# Patient Record
Sex: Male | Born: 1955 | Race: White | Hispanic: Yes | Marital: Married | Smoking: Former smoker
Health system: Southern US, Community
[De-identification: ages and names within clinical notes are randomized; demographics above are authoritative.]

## PROBLEM LIST (undated history)

## (undated) DIAGNOSIS — M4312 Spondylolisthesis, cervical region: Secondary | ICD-10-CM

## (undated) DIAGNOSIS — M1990 Unspecified osteoarthritis, unspecified site: Secondary | ICD-10-CM

## (undated) DIAGNOSIS — D492 Neoplasm of unspecified behavior of bone, soft tissue, and skin: Secondary | ICD-10-CM

## (undated) DIAGNOSIS — N521 Erectile dysfunction due to diseases classified elsewhere: Secondary | ICD-10-CM

## (undated) DIAGNOSIS — N400 Enlarged prostate without lower urinary tract symptoms: Secondary | ICD-10-CM

## (undated) DIAGNOSIS — I10 Essential (primary) hypertension: Secondary | ICD-10-CM

## (undated) DIAGNOSIS — K859 Acute pancreatitis without necrosis or infection, unspecified: Secondary | ICD-10-CM

## (undated) DIAGNOSIS — M47812 Spondylosis without myelopathy or radiculopathy, cervical region: Secondary | ICD-10-CM

## (undated) DIAGNOSIS — K219 Gastro-esophageal reflux disease without esophagitis: Secondary | ICD-10-CM

## (undated) DIAGNOSIS — Z1509 Genetic susceptibility to other malignant neoplasm: Secondary | ICD-10-CM

## (undated) DIAGNOSIS — E785 Hyperlipidemia, unspecified: Secondary | ICD-10-CM

## (undated) DIAGNOSIS — M5030 Other cervical disc degeneration, unspecified cervical region: Secondary | ICD-10-CM

## (undated) DIAGNOSIS — M5410 Radiculopathy, site unspecified: Secondary | ICD-10-CM

## (undated) DIAGNOSIS — M5090 Cervical disc disorder, unspecified, unspecified cervical region: Secondary | ICD-10-CM

## (undated) DIAGNOSIS — J309 Allergic rhinitis, unspecified: Secondary | ICD-10-CM

## (undated) DIAGNOSIS — G542 Cervical root disorders, not elsewhere classified: Secondary | ICD-10-CM

## (undated) DIAGNOSIS — Z800 Family history of malignant neoplasm of digestive organs: Secondary | ICD-10-CM

## (undated) DIAGNOSIS — Z806 Family history of leukemia: Secondary | ICD-10-CM

## (undated) DIAGNOSIS — E119 Type 2 diabetes mellitus without complications: Secondary | ICD-10-CM

## (undated) DIAGNOSIS — R519 Headache, unspecified: Secondary | ICD-10-CM

## (undated) HISTORY — PX: TRIGGER FINGER RELEASE: SHX641

## (undated) HISTORY — DX: Neoplasm of unspecified behavior of bone, soft tissue, and skin: D49.2

## (undated) HISTORY — PX: SHOULDER SURGERY: SHX246

## (undated) HISTORY — DX: Family history of leukemia: Z80.6

## (undated) HISTORY — DX: Family history of malignant neoplasm of digestive organs: Z80.0

## (undated) HISTORY — PX: ANKLE SURGERY: SHX546

---

## 2015-01-19 DIAGNOSIS — K219 Gastro-esophageal reflux disease without esophagitis: Secondary | ICD-10-CM | POA: Insufficient documentation

## 2015-01-19 DIAGNOSIS — E1165 Type 2 diabetes mellitus with hyperglycemia: Secondary | ICD-10-CM | POA: Insufficient documentation

## 2015-01-19 DIAGNOSIS — M5090 Cervical disc disorder, unspecified, unspecified cervical region: Secondary | ICD-10-CM | POA: Insufficient documentation

## 2015-01-19 DIAGNOSIS — E782 Mixed hyperlipidemia: Secondary | ICD-10-CM | POA: Insufficient documentation

## 2015-01-19 DIAGNOSIS — N401 Enlarged prostate with lower urinary tract symptoms: Secondary | ICD-10-CM | POA: Insufficient documentation

## 2015-01-19 DIAGNOSIS — IMO0002 Reserved for concepts with insufficient information to code with codable children: Secondary | ICD-10-CM | POA: Insufficient documentation

## 2015-01-19 DIAGNOSIS — I10 Essential (primary) hypertension: Secondary | ICD-10-CM | POA: Insufficient documentation

## 2015-01-19 DIAGNOSIS — N138 Other obstructive and reflux uropathy: Secondary | ICD-10-CM | POA: Insufficient documentation

## 2015-02-27 ENCOUNTER — Ambulatory Visit
Admit: 2015-02-27 | Disposition: A | Discharge status: Home / Self Care | Payer: Self-pay | Attending: Family Medicine | Admitting: Family Medicine

## 2015-06-21 DIAGNOSIS — M5030 Other cervical disc degeneration, unspecified cervical region: Secondary | ICD-10-CM | POA: Insufficient documentation

## 2015-06-21 DIAGNOSIS — M5412 Radiculopathy, cervical region: Secondary | ICD-10-CM | POA: Insufficient documentation

## 2015-07-18 ENCOUNTER — Other Ambulatory Visit: Payer: Self-pay | Admitting: Urology

## 2015-08-01 ENCOUNTER — Encounter: Payer: Self-pay | Admitting: *Deleted

## 2015-08-02 ENCOUNTER — Ambulatory Visit: Payer: BLUE CROSS/BLUE SHIELD | Admitting: Anesthesiology

## 2015-08-02 ENCOUNTER — Ambulatory Visit
Admission: RE | Admit: 2015-08-02 | Discharge: 2015-08-02 | Disposition: A | Discharge status: Home / Self Care | Payer: BLUE CROSS/BLUE SHIELD | Source: Ambulatory Visit | Attending: Gastroenterology | Admitting: Gastroenterology

## 2015-08-02 ENCOUNTER — Encounter
Admission: RE | Disposition: A | Discharge status: Home / Self Care | Payer: Self-pay | Source: Ambulatory Visit | Attending: Gastroenterology

## 2015-08-02 ENCOUNTER — Encounter: Payer: Self-pay | Admitting: Anesthesiology

## 2015-08-02 DIAGNOSIS — I10 Essential (primary) hypertension: Secondary | ICD-10-CM | POA: Insufficient documentation

## 2015-08-02 DIAGNOSIS — N400 Enlarged prostate without lower urinary tract symptoms: Secondary | ICD-10-CM | POA: Insufficient documentation

## 2015-08-02 DIAGNOSIS — K219 Gastro-esophageal reflux disease without esophagitis: Secondary | ICD-10-CM | POA: Insufficient documentation

## 2015-08-02 DIAGNOSIS — Z79899 Other long term (current) drug therapy: Secondary | ICD-10-CM | POA: Diagnosis not present

## 2015-08-02 DIAGNOSIS — Z1211 Encounter for screening for malignant neoplasm of colon: Secondary | ICD-10-CM | POA: Insufficient documentation

## 2015-08-02 DIAGNOSIS — Z794 Long term (current) use of insulin: Secondary | ICD-10-CM | POA: Insufficient documentation

## 2015-08-02 DIAGNOSIS — Z87891 Personal history of nicotine dependence: Secondary | ICD-10-CM | POA: Insufficient documentation

## 2015-08-02 DIAGNOSIS — J309 Allergic rhinitis, unspecified: Secondary | ICD-10-CM | POA: Insufficient documentation

## 2015-08-02 DIAGNOSIS — M5090 Cervical disc disorder, unspecified, unspecified cervical region: Secondary | ICD-10-CM | POA: Diagnosis not present

## 2015-08-02 DIAGNOSIS — Z8371 Family history of colonic polyps: Secondary | ICD-10-CM | POA: Insufficient documentation

## 2015-08-02 DIAGNOSIS — E119 Type 2 diabetes mellitus without complications: Secondary | ICD-10-CM | POA: Diagnosis not present

## 2015-08-02 DIAGNOSIS — E785 Hyperlipidemia, unspecified: Secondary | ICD-10-CM | POA: Diagnosis not present

## 2015-08-02 HISTORY — DX: Gastro-esophageal reflux disease without esophagitis: K21.9

## 2015-08-02 HISTORY — DX: Benign prostatic hyperplasia without lower urinary tract symptoms: N40.0

## 2015-08-02 HISTORY — DX: Essential (primary) hypertension: I10

## 2015-08-02 HISTORY — DX: Hyperlipidemia, unspecified: E78.5

## 2015-08-02 HISTORY — DX: Allergic rhinitis, unspecified: J30.9

## 2015-08-02 HISTORY — DX: Type 2 diabetes mellitus without complications: E11.9

## 2015-08-02 HISTORY — DX: Cervical disc disorder, unspecified, unspecified cervical region: M50.90

## 2015-08-02 HISTORY — DX: Erectile dysfunction due to diseases classified elsewhere: N52.1

## 2015-08-02 HISTORY — PX: COLONOSCOPY WITH PROPOFOL: SHX5780

## 2015-08-02 LAB — GLUCOSE, CAPILLARY: Glucose-Capillary: 101 mg/dL — ABNORMAL HIGH (ref 65–99)

## 2015-08-02 SURGERY — COLONOSCOPY WITH PROPOFOL
Anesthesia: General

## 2015-08-02 MED ORDER — PROPOFOL 10 MG/ML IV BOLUS
INTRAVENOUS | Status: DC | PRN
  Administered 2015-08-02: 40 mg via INTRAVENOUS
  Administered 2015-08-02: 30 mg via INTRAVENOUS
  Administered 2015-08-02: 80 mg via INTRAVENOUS
  Administered 2015-08-02: 30 mg via INTRAVENOUS

## 2015-08-02 MED ORDER — LIDOCAINE HCL (CARDIAC) 20 MG/ML IV SOLN
INTRAVENOUS | Status: DC | PRN
  Administered 2015-08-02: 20 mg via INTRAVENOUS

## 2015-08-02 MED ORDER — GLYCOPYRROLATE 0.2 MG/ML IJ SOLN
INTRAMUSCULAR | Status: DC | PRN
  Administered 2015-08-02: 0.2 mg via INTRAVENOUS

## 2015-08-02 MED ORDER — SODIUM CHLORIDE 0.9 % IV SOLN
INTRAVENOUS | Status: DC
  Administered 2015-08-02: 1000 mL via INTRAVENOUS

## 2015-08-02 MED ORDER — SODIUM CHLORIDE 0.9 % IV SOLN: INTRAVENOUS | Status: DC

## 2015-08-02 MED ORDER — PROPOFOL 500 MG/50ML IV EMUL
INTRAVENOUS | Status: DC | PRN
  Administered 2015-08-02: 125 ug/kg/min via INTRAVENOUS

## 2015-08-02 NOTE — Anesthesia Preprocedure Evaluation (Signed)
Anesthesia Evaluation  Patient identified by MRN, date of birth, ID band Patient awake    Reviewed: Allergy & Precautions, H&P , NPO status , Patient's Chart, lab work & pertinent test results, reviewed documented beta blocker date and time   History of Anesthesia Complications Negative for: history of anesthetic complications  Airway Mallampati: II  TM Distance: >3 FB Neck ROM: full    Dental no notable dental hx. (+) Teeth Intact, Caps   Pulmonary neg shortness of breath, neg sleep apnea, neg COPD, neg recent URI, former smoker,    Pulmonary exam normal breath sounds clear to auscultation       Cardiovascular Exercise Tolerance: Good hypertension, On Medications (-) angina(-) CAD, (-) Past MI, (-) Cardiac Stents and (-) CABG Normal cardiovascular exam(-) dysrhythmias (-) Valvular Problems/Murmurs Rhythm:regular Rate:Normal     Neuro/Psych negative neurological ROS  negative psych ROS   GI/Hepatic Neg liver ROS, GERD  Medicated and Controlled,  Endo/Other  diabetes, Well Controlled, Type 2, Insulin Dependent  Renal/GU negative Renal ROS  negative genitourinary   Musculoskeletal   Abdominal   Peds  Hematology negative hematology ROS (+)   Anesthesia Other Findings Past Medical History:   Cervical disc disease                                        Erectile disorder due to medical condition in *              Allergic rhinitis                                            Diabetes mellitus without complication                       Hypertension                                                 GERD (gastroesophageal reflux disease)                       Hyperlipidemia                                               BPH (benign prostatic hyperplasia)                           Reproductive/Obstetrics negative OB ROS                             Anesthesia Physical Anesthesia Plan  ASA:  III  Anesthesia Plan: General   Post-op Pain Management:    Induction:   Airway Management Planned:   Additional Equipment:   Intra-op Plan:   Post-operative Plan:   Informed Consent: I have reviewed the patients History and Physical, chart, labs and discussed the procedure including the risks, benefits and alternatives for the proposed anesthesia with the patient or authorized representative who has indicated his/her understanding  and acceptance.   Dental Advisory Given  Plan Discussed with: Anesthesiologist, CRNA and Surgeon  Anesthesia Plan Comments:         Anesthesia Quick Evaluation

## 2015-08-02 NOTE — Transfer of Care (Signed)
Immediate Anesthesia Transfer of Care Note  Patient: Brian Farmer  Procedure(s) Performed: Procedure(s): COLONOSCOPY WITH PROPOFOL (N/A)  Patient Location: Endoscopy Unit  Anesthesia Type:General  Level of Consciousness: sedated  Airway & Oxygen Therapy: Patient Spontanous Breathing and Patient connected to nasal cannula oxygen  Post-op Assessment: Report given to RN and Post -op Vital signs reviewed and stable  Post vital signs: Reviewed and stable  Last Vitals:  Filed Vitals:   08/02/15 0708  BP: 122/71  Pulse: 69  Temp: 36.6 C  Resp: 16    Complications: No apparent anesthesia complications

## 2015-08-02 NOTE — Op Note (Signed)
[facility] Gastroenterology Patient Name: Brian Farmer Procedure Date: 08/02/2015 8:04 AM MRN: [id number] Account #: [id number] Date of Birth: 22-May-1956 Admit Type: Outpatient Age: 59 Room: [facility] ENDO ROOM 4 Gender: Male Note Status: Finalized Procedure:         Colonoscopy Indications:       Colon cancer screening in patient at increased risk:                     Family history of familial adenomatous polyposis,                     Muir-Torre syndrome/Lynch syndrome Providers:         Lupita Dawn. Candace Cruise, MD Referring MD:      [name] (Referring MD) Medicines:         Monitored Anesthesia Care Complications:     No immediate complications. Procedure:         Pre-Anesthesia Assessment:                    - Prior to the procedure, a History and Physical was                     performed, and patient medications, allergies and                     sensitivities were reviewed. The patient's tolerance of                     previous anesthesia was reviewed.                    - The risks and benefits of the procedure and the sedation                     options and risks were discussed with the patient. All                     questions were answered and informed consent was obtained.                    - After reviewing the risks and benefits, the patient was                     deemed in satisfactory condition to undergo the procedure.                    After obtaining informed consent, the colonoscope was                     passed under direct vision. Throughout the procedure, the                     patient's blood pressure, pulse, and oxygen saturations                     were monitored continuously. The Colonoscope was                     introduced through the anus and advanced to the the cecum,                     identified by appendiceal orifice and ileocecal valve. The  colonoscopy was performed without difficulty. The patient                   tolerated the procedure well. The quality of the bowel                     preparation was good. Findings:      The colon (entire examined portion) appeared normal. Impression:        - The entire examined colon is normal.                    - No specimens collected. Recommendation:    - Discharge patient to home.                    - Repeat colonoscopy in 1 year for surveillance.                    - The findings and recommendations were discussed with the                     patient.                    - Schedule baseline EGD by next year. Procedure Code(s): --- Professional ---                    [phone], Colonoscopy, flexible; diagnostic, including                     collection of specimen(s) by brushing or washing, when                     performed (separate procedure) Diagnosis Code(s): --- Professional ---                    Z83.71, Family history of colonic polyps CPT copyright 2014 American Medical Association. All rights reserved. The codes documented in this report are preliminary and upon coder review may  be revised to meet current compliance requirements. Hulen Luster, MD 08/02/2015 8:29:52 AM This report has been signed electronically. Number of Addenda: 0 Note Initiated On: 08/02/2015 8:04 AM Scope Withdrawal Time: 0 hours 12 minutes 0 seconds  Total Procedure Duration: 0 hours 16 minutes 43 seconds       [facility]

## 2015-08-02 NOTE — Anesthesia Postprocedure Evaluation (Signed)
  Anesthesia Post-op Note  Patient: Brian Farmer  Procedure(s) Performed: Procedure(s): COLONOSCOPY WITH PROPOFOL (N/A)  Anesthesia type:General  Patient location: PACU  Post pain: Pain level controlled  Post assessment: Post-op Vital signs reviewed, Patient's Cardiovascular Status Stable, Respiratory Function Stable, Patent Airway and No signs of Nausea or vomiting  Post vital signs: Reviewed and stable  Last Vitals:  Filed Vitals:   08/02/15 0905  BP:   Pulse: 62  Temp:   Resp: 12    Level of consciousness: awake, alert  and patient cooperative  Complications: No apparent anesthesia complications

## 2015-08-02 NOTE — H&P (Signed)
    Primary Care Physician:  [facility], MD Primary Gastroenterologist:  Dr. Candace Cruise  Pre-Procedure History & Physical: HPI:  Brian Farmer is a 59 y.o. male is here for an colonscopy   Past Medical History  Diagnosis Date  . Cervical disc disease   . Erectile disorder 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   . Allergic rhinitis   . Diabetes mellitus without complication   . Hypertension   . GERD (gastroesophageal reflux disease)   . Hyperlipidemia   . BPH (benign prostatic hyperplasia)     Past Surgical History  Procedure Laterality Date  . Shoulder surgery    . Ankle surgery      Prior to Admission medications   Medication Sig Start Date End Date Taking? Authorizing Provider  amLODipine (NORVASC) 5 MG tablet Take 5 mg by mouth daily.   Yes Historical Provider, MD  fluticasone (FLONASE) 50 MCG/ACT nasal spray Place into both nostrils daily.   Yes Historical Provider, MD  Insulin Glargine (TOUJEO SOLOSTAR) 300 UNIT/ML SOPN Inject 50 Units into the skin 3 (three) times daily before meals.   Yes Historical Provider, MD  lisinopril (PRINIVIL,ZESTRIL) 40 MG tablet Take 40 mg by mouth daily.   Yes Historical Provider, MD  metFORMIN (GLUMETZA) 500 MG (MOD) 24 hr tablet Take 500 mg by mouth 2 (two) times daily with a meal.   Yes Historical Provider, MD  omeprazole (PRILOSEC) 20 MG capsule Take 20 mg by mouth daily.   Yes Historical Provider, MD  [facility] DELICA LANCETS 16B MISC by Does not apply route.   Yes Historical Provider, MD  RAPAFLO 8 MG CAPS capsule TAKE 1 CAPSULE 1 TIME DAILY 07/19/15  Yes Shannon A McGowan, PA-C  rosuvastatin (CRESTOR) 40 MG tablet Take 40 mg by mouth daily.   Yes Historical Provider, MD    Allergies as of 06/25/2015  . (Not on File)    History reviewed. No pertinent family history.  Social History   Social History  . Marital Status: Married    Spouse Name: N/A  . Number of Children: N/A  . Years of Education: N/A   Occupational  History  . Not on file.   Social History Main Topics  . Smoking status: Former Research scientist (life sciences)  . Smokeless tobacco: Never Used  . Alcohol Use: No  . Drug Use: No  . Sexual Activity: Not on file   Other Topics Concern  . Not on file   Social History Narrative    Review of Systems: See HPI, otherwise negative ROS  Physical Exam: BP 122/71 mmHg  Pulse 69  Temp(Src) 97.8 F (36.6 C) (Tympanic)  Resp 16  Ht 5\' 8"  (1.727 m)  Wt 78.472 kg (173 lb)  BMI 26.31 kg/m2  SpO2 100% General:   Alert,  pleasant and cooperative in NAD Head:  Normocephalic and atraumatic. Neck:  Supple; no masses or thyromegaly. Lungs:  Clear throughout to auscultation.    Heart:  Regular rate and rhythm. Abdomen:  Soft, nontender and nondistended. Normal bowel sounds, without guarding, and without rebound.   Neurologic:  Alert and  oriented x4;  grossly normal neurologically.  Impression/Plan: Brian Farmer is here for an colonoscopy to be performed for screening.  Risks, benefits, limitations, and alternatives regarding colonoscopy have been reviewed with the patient.  Questions have been answered.  All parties agreeable.   Brian Farmer, Brian Dawn, MD  08/02/2015, 8:01 AM

## 2015-11-12 ENCOUNTER — Other Ambulatory Visit: Payer: Self-pay | Admitting: Urology

## 2015-11-14 ENCOUNTER — Other Ambulatory Visit: Payer: Self-pay | Admitting: Urology

## 2015-11-14 DIAGNOSIS — N400 Enlarged prostate without lower urinary tract symptoms: Secondary | ICD-10-CM

## 2015-11-29 ENCOUNTER — Other Ambulatory Visit: Payer: Self-pay

## 2015-11-29 DIAGNOSIS — N400 Enlarged prostate without lower urinary tract symptoms: Secondary | ICD-10-CM

## 2015-11-29 MED ORDER — SILODOSIN 8 MG PO CAPS: 8.0000 mg | ORAL_CAPSULE | Freq: Every day | ORAL | Status: DC

## 2015-11-29 NOTE — Progress Notes (Signed)
Pt pharmacy requested a refill of rapaflo. Pt was given 30 days and no refills. Pt needs an appt.

## 2016-05-07 ENCOUNTER — Ambulatory Visit
Admission: EM | Admit: 2016-05-07 | Discharge: 2016-05-07 | Disposition: A | Discharge status: Other Acute Inpatient Hospital | Payer: BLUE CROSS/BLUE SHIELD | Attending: Family Medicine | Admitting: Family Medicine

## 2016-05-07 ENCOUNTER — Ambulatory Visit
Admit: 2016-05-07 | Discharge: 2016-05-07 | Disposition: A | Discharge status: Home / Self Care | Payer: BLUE CROSS/BLUE SHIELD | Attending: Family Medicine | Admitting: Family Medicine

## 2016-05-07 ENCOUNTER — Encounter: Payer: Self-pay | Admitting: Emergency Medicine

## 2016-05-07 DIAGNOSIS — N50811 Right testicular pain: Secondary | ICD-10-CM

## 2016-05-07 DIAGNOSIS — N453 Epididymo-orchitis: Secondary | ICD-10-CM | POA: Diagnosis not present

## 2016-05-07 DIAGNOSIS — N503 Cyst of epididymis: Secondary | ICD-10-CM | POA: Insufficient documentation

## 2016-05-07 LAB — URINALYSIS COMPLETE WITH MICROSCOPIC (ARMC ONLY)
Bacteria, UA: NONE SEEN
Bilirubin Urine: NEGATIVE
Glucose, UA: 250 mg/dL — AB
HGB URINE DIPSTICK: NEGATIVE
Ketones, ur: NEGATIVE mg/dL
LEUKOCYTES UA: NEGATIVE
NITRITE: NEGATIVE
PH: 5 (ref 5.0–8.0)
Protein, ur: NEGATIVE mg/dL
RBC / HPF: NONE SEEN RBC/hpf (ref 0–5)
Specific Gravity, Urine: 1.02 (ref 1.005–1.030)
Squamous Epithelial / LPF: NONE SEEN

## 2016-05-07 MED ORDER — LEVOFLOXACIN 500 MG PO TABS: 500.0000 mg | ORAL_TABLET | Freq: Every day | ORAL | Status: DC

## 2016-05-07 NOTE — ED Notes (Signed)
Patient c/o swelling and pain in his right testicle for the past couple of days.  Patient denies fevers.

## 2016-05-07 NOTE — ED Provider Notes (Signed)
CSN: [id number]     Arrival date & time 05/07/16  1342 History   First MD Initiated Contact with Patient 05/07/16 1404    Nurses notes were reviewed. Chief Complaint  Patient presents with  . Testicle Pain   Patient his wife comes in today stating that he has a history of right inguinal hernia they think that the hernia has actually become strangulate because the patient has developed started having right inguinal pain. This right inguinal pain has been going on over week almost 2 weeks and is continued to get worse. He states he cannot sit down back pain and discomfort. He reports when he was young man he was told that he had a right inguinal hernia but has never had surgery was offered surgery. This was in [place] when he was diagnosed. His PCP here in [facility] never found or discuss with him concern over this right inguinal hernia that he had. He denies any sexual activity outside of marriage and reports being in a monogamous relationship. He did not seem discharge or burning on urination or frequency.  He has multiple medical problems including hyperlipidemia GERD hypertension diabetes and BPH and erectile dysfunction. He has had colonoscopy ankle surgery and shoulder surgery in the past.  He is allergic to Vicodin. No significant past medical family history pertinent to today's visit.  (Consider location/radiation/quality/duration/timing/severity/associated sxs/prior Treatment) Patient is a 60 y.o. male presenting with testicular pain. The history is provided by the patient and the spouse. No language interpreter was used.  Testicle Pain This is a new problem. The current episode started more than 1 week ago. The problem occurs constantly. The problem has been gradually worsening. Pertinent negatives include no chest pain, no abdominal pain, no headaches and no shortness of breath. The symptoms are aggravated by twisting, standing and bending (moving). Nothing relieves the symptoms. The  treatment provided no relief.    Past Medical History  Diagnosis Date  . Cervical disc disease   . Erectile disorder 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   . Allergic rhinitis   . Diabetes mellitus without complication ([place])   . Hypertension   . GERD (gastroesophageal reflux disease)   . Hyperlipidemia   . BPH (benign prostatic hyperplasia)    Past Surgical History  Procedure Laterality Date  . Shoulder surgery    . Ankle surgery    . Colonoscopy with propofol N/A 08/02/2015    Procedure: COLONOSCOPY WITH PROPOFOL;  Surgeon: Hulen Luster, MD;  Location: [facility] ENDOSCOPY;  Service: Gastroenterology;  Laterality: N/A;   History reviewed. No pertinent family history. Social History  Substance Use Topics  . Smoking status: Former Research scientist (life sciences)  . Smokeless tobacco: Never Used  . Alcohol Use: No    Review of Systems  HENT: Negative for ear discharge.   Respiratory: Negative for shortness of breath.   Cardiovascular: Negative for chest pain.  Gastrointestinal: Negative for abdominal pain.  Genitourinary: Positive for scrotal swelling and testicular pain.  Neurological: Negative for headaches.  All other systems reviewed and are negative.   Allergies  Vicodin  Home Medications   Prior to Admission medications   Medication Sig Start Date End Date Taking? Authorizing Provider  amLODipine (NORVASC) 5 MG tablet Take 5 mg by mouth daily.    Historical Provider, MD  fluticasone (FLONASE) 50 MCG/ACT nasal spray Place into both nostrils daily.    Historical Provider, MD  Insulin Glargine (TOUJEO SOLOSTAR) 300 UNIT/ML SOPN Inject 50 Units into the skin 3 (three)  times daily before meals.    Historical Provider, MD  levofloxacin (LEVAQUIN) 500 MG tablet Take 1 tablet (500 mg total) by mouth daily. 05/07/16   Frederich Cha, MD  lisinopril (PRINIVIL,ZESTRIL) 40 MG tablet Take 40 mg by mouth daily.    Historical Provider, MD  metFORMIN (GLUMETZA) 500 MG (MOD) 24 hr tablet Take 500 mg by mouth 2  (two) times daily with a meal.    Historical Provider, MD  omeprazole (PRILOSEC) 20 MG capsule Take 20 mg by mouth daily.    Historical Provider, MD  [facility] DELICA LANCETS [id number] MISC by Does not apply route.    Historical Provider, MD  RAPAFLO 8 MG CAPS capsule TAKE 1 CAPSULE 1 TIME DAILY 11/15/15   Nori Riis, PA-C  rosuvastatin (CRESTOR) 40 MG tablet Take 40 mg by mouth daily.    Historical Provider, MD  silodosin (RAPAFLO) 8 MG CAPS capsule Take 1 capsule (8 mg total) by mouth daily with breakfast. 11/29/15   Nori Riis, PA-C   Meds Ordered and Administered this Visit  Medications - No data to display  BP 137/70 mmHg  Pulse 72  Temp(Src) 98.5 F (36.9 C) (Tympanic)  Resp 16  Ht 5\' 8"  (1.727 m)  Wt 173 lb (78.472 kg)  BMI 26.31 kg/m2  SpO2 98% No data found.   Physical Exam  Constitutional: He is oriented to person, place, and time. He appears well-developed and well-nourished.  HENT:  Head: Normocephalic and atraumatic.  Eyes: Pupils are equal, round, and reactive to light.  Neck: Neck supple.  Pulmonary/Chest: Effort normal.  Abdominal: Hernia confirmed negative in the right inguinal area.  Genitourinary: Right testis shows tenderness. Circumcised.  Patient is markedly tender over the right testicle. I cannot detect any tenderness in the inguinal canal he also has tenderness over his right epididymis as well making the diagnosis more consistent with a right epididymitis and orchitis  Musculoskeletal: He exhibits no tenderness.  Lymphadenopathy:       Right: No inguinal adenopathy present.  Neurological: He is alert and oriented to person, place, and time.  Skin: Skin is warm.  Psychiatric: He has a normal mood and affect. His behavior is normal. Thought content normal.  Vitals reviewed.   ED Course  Procedures (including critical care time)  Labs Review Labs Reviewed  URINALYSIS COMPLETEWITH MICROSCOPIC ([place] ONLY) - Abnormal; Notable for the following:     Color, Urine STRAW (*)    Glucose, UA 250 (*)    All other components within normal limits  CHLAMYDIA/NGC RT PCR ([facility] ONLY)  URINE CULTURE    Imaging Review No results found.   Visual Acuity Review  Right Eye Distance:   Left Eye Distance:   Bilateral Distance:    Right Eye Near:   Left Eye Near:    Bilateral Near:     Results for orders placed or performed during the hospital encounter of 05/07/16  Urinalysis complete, with microscopic  Result Value Ref Range   Color, Urine STRAW (A) YELLOW   APPearance CLEAR CLEAR   Glucose, UA 250 (A) NEGATIVE mg/dL   Bilirubin Urine NEGATIVE NEGATIVE   Ketones, ur NEGATIVE NEGATIVE mg/dL   Specific Gravity, Urine 1.020 1.005 - 1.030   Hgb urine dipstick NEGATIVE NEGATIVE   pH 5.0 5.0 - 8.0   Protein, ur NEGATIVE NEGATIVE mg/dL   Nitrite NEGATIVE NEGATIVE   Leukocytes, UA NEGATIVE NEGATIVE   RBC / HPF NONE SEEN 0 - 5 RBC/hpf   WBC, UA  0-5 0 - 5 WBC/hpf   Bacteria, UA NONE SEEN NONE SEEN   Squamous Epithelial / LPF NONE SEEN NONE SEEN      MDM   1. Orchitis and epididymitis   2. Testicular pain, right   Patient will be sent to get ultrasound of the right testicle just to make sure this no other obstruction or problems since he does have that question of history of right inguinal hernia. But I think he has no orchitis and epididymitis treat him with Levaquin 500 mg 1 tablet day. Warned about the tendon dysfunction that can occur with Levaquin. Follow-up with his PCP in a week. Should be noted that he had a surgical referral but couldn't see the surgeon until next week. Work note given for today and tomorrow as well.  Note: This dictation was prepared with Dragon dictation along with smaller phrase technology. Any transcriptional errors that result from this process are unintentional.     Frederich Cha, MD 05/07/16 1536

## 2016-05-07 NOTE — ED Notes (Signed)
USs scheduled for today at [facility]

## 2016-05-07 NOTE — Discharge Instructions (Signed)
Orchitis Orchitis is swelling (inflammation) of a testicle caused by infection. Testicles are the male organs that produce sperm. The testicles are held in a fleshy sac (scrotum) located behind the penis. Orchitis usually affects only one testicle, but it can occur in both. The condition can develop suddenly. Orchitis can be caused by many different kinds of bacteria and viruses. CAUSES Orchitis can be caused by either a bacterial or viral infection. Bacterial Infections  These often occur along with an infection of the coiled tube that collects sperm and sits on top of the testicle (epididymis).  In men who are not sexually active, bacterial orchitis usually starts as a urinary tract infection and spreads to the testicle.  In sexually active men, sexually transmitted infections are the most common cause of bacterial orchitis. These can include:  Gonorrhea.  Chlamydia. Viral Infections  Mumps is still the most common cause of viral orchitis, though mumps is now rare in many areas because of vaccination.  Other viruses that can cause orchitis include:  The chickenpox virus (varicella-zoster virus).  The virus that causes mononucleosis (Epstein-Barr virus). RISK FACTORS Boys and men who have not been vaccinated against mumps are at risk for mumps orchitis. Risk factors for bacterial orchitis include:  Frequent urinary tract infections.  High-risk sexual behaviors.  Having a sexual partner with a sexually transmitted infection.  Having had urinary tract surgery.  Using a tube passed through the penis to drain urine (Foley catheter).  An enlarged prostate gland. SIGNS AND SYMPTOMS The most common symptoms of orchitis are swelling and pain in the scrotum. Other signs and symptoms may include:  Feeling generally sick (malaise).  Fever and chills.  Painful urination.  Painful ejaculation.  Blood or discharge from the  penis.  Nausea.  Headache.  Fatigue. DIAGNOSIS Your health care provider may suspect orchitis if you have a painful, swollen testicle along with other signs and symptoms of the condition. A physical exam will be done. Tests may also be done to help your health care provider make a diagnosis. These may include:  A blood test to check for signs of infection.  A urine test to check for a urinary tract infection.  Using a swab to collect a fluid sample from the tip of the penis to test for sexually transmitted infections.  Taking an image of the testicle using sound waves and a computer (testicular ultrasound). TREATMENT Treatment of orchitis depends on the cause. For orchitis caused by a bacterial infection, your health care provider will most likely prescribe antibiotic medicines. Bacterial infections usually clear up within a few days. Both viral infections and bacterial infections may be treated with:  Bed rest.  Anti-inflammatory medicines.  Pain medicines.  Elevating the scrotum and applying ice. HOME CARE INSTRUCTIONS  Rest as directed by your health care provider.  Take medicines only as directed by your health care provider.  If you were prescribed an antibiotic medicine, finish it all even if you start to feel better.  Elevate your scrotum and apply ice as directed:  Put ice in a plastic bag.  Place a small towel or pillow between your legs.  Rest your scrotum on the pillow or towel.  Place another towel between your skin and the plastic bag.  Leave the ice on for 20 minutes, 2-3 times a day. SEEK MEDICAL CARE IF:  You have a fever.  Pain and swelling have not gotten better after 3 days. SEEK IMMEDIATE MEDICAL CARE IF:  Your pain is getting  worse.  The swelling in your testicle gets worse.   This information is not intended to replace advice given to you by your health care provider. Make sure you discuss any questions you have with your health care  provider.   Document Released: 10/17/2000 Document Revised: 11/10/2014 Document Reviewed: 03/09/2014 Elsevier Interactive Patient Education [employer].

## 2016-05-08 LAB — URINE CULTURE
Culture: NO GROWTH
Special Requests: NORMAL

## 2016-05-08 LAB — CHLAMYDIA/NGC RT PCR (ARMC ONLY)
Chlamydia Tr: NOT DETECTED
N GONORRHOEAE: NOT DETECTED

## 2016-07-18 ENCOUNTER — Ambulatory Visit (INDEPENDENT_AMBULATORY_CARE_PROVIDER_SITE_OTHER): Payer: BLUE CROSS/BLUE SHIELD | Admitting: Urology

## 2016-07-18 ENCOUNTER — Encounter: Payer: Self-pay | Admitting: Urology

## 2016-07-18 VITALS — BP 152/63 | HR 77 | Ht 68.0 in | Wt 181.0 lb

## 2016-07-18 DIAGNOSIS — N50819 Testicular pain, unspecified: Secondary | ICD-10-CM | POA: Diagnosis not present

## 2016-07-18 DIAGNOSIS — N400 Enlarged prostate without lower urinary tract symptoms: Secondary | ICD-10-CM | POA: Diagnosis not present

## 2016-07-18 DIAGNOSIS — G8929 Other chronic pain: Secondary | ICD-10-CM | POA: Diagnosis not present

## 2016-07-18 DIAGNOSIS — N529 Male erectile dysfunction, unspecified: Secondary | ICD-10-CM

## 2016-07-18 NOTE — Progress Notes (Signed)
07/18/2016 9:15 PM   Brian Farmer 1956/10/23 [id number]  Referring provider: [name], MD [address] [place], [place] [address]  Chief Complaint  Patient presents with  . Testicle Pain    follow up    HPI: 60 year old male followed here for history of BPH and erectile dysfunction who presents today for further evaluation and management of chronic right testicular discomfort.  He developed relatively acute onset severe right scrotal pain on 05/07/2016. He was seen and evaluated in urgent care. Scrotal shots that time was fairly unremarkable. There is no evidence of radiographic epididymoorchitis or torsion. He was treated for presumed epididymitis which improved dramatically within 24 hours of starting antibiotic.  Unfortunately, he continued to have mild right scrotal pain which has been persistent since that time. It waxes and wanes in severity but is mostly dull and aching.  He can make the pain increased in severity by tightening his belt.  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 He reports that at that time, he was wearing heavy belt which seemed to aggravate this.   He has no urinary complaints today including no dysuria or gross hematuria. He does have a history of BPH which is well managed with Cialis 5 mg daily. He feels like he has a decent stream is able to empty his bladder. When he misses a dose, his symptoms recur.  He also has a history of erectile dysfunction. He tried other PDE 5 inhibitor such as Viagra with side effects of headache. Even with the Cialis 5 mg daily, he has difficulty maintaining his erection. He is also previously tried injections which he did not like. He does not want to pursue any further intervention.   PMH: Past Medical History:  Diagnosis Date  . Allergic rhinitis   . BPH (benign  prostatic hyperplasia)   . Cervical disc disease   . Diabetes mellitus without complication ([place])   . Erectile disorder 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   . GERD (gastroesophageal reflux disease)   . Hyperlipidemia   . Hypertension     Surgical History: Past Surgical History:  Procedure Laterality Date  . ANKLE SURGERY    . COLONOSCOPY WITH PROPOFOL N/A 08/02/2015   Procedure: COLONOSCOPY WITH PROPOFOL;  Surgeon: Hulen Luster, MD;  Location: [facility] ENDOSCOPY;  Service: Gastroenterology;  Laterality: N/A;  . SHOULDER SURGERY      Home Medications:    Medication List       Accurate as of 07/18/16 11:59 PM. Always use your most recent med list.          amLODipine 5 MG tablet Commonly known as:  NORVASC Take 5 mg by mouth daily.   cyanocobalamin 1000 MCG/ML injection Commonly known as:  (VITAMIN B-12) Inject into the muscle.   fluticasone 50 MCG/ACT nasal spray Commonly known as:  FLONASE Place into both nostrils daily.   gabapentin 300 MG capsule Commonly known as:  NEURONTIN TK 1 C PO TID   insulin aspart 100 UNIT/ML FlexPen Commonly known as:  NOVOLOG Inject upto 70u per day   lisinopril 40 MG tablet Commonly known as:  PRINIVIL,ZESTRIL Take 40 mg by mouth daily.   omeprazole 20 MG capsule Commonly known as:  PRILOSEC Take 20 mg by mouth daily.   ONETOUCH DELICA LANCETS [id number] Misc by Does not apply  route.   RAPAFLO 8 MG Caps capsule Generic drug:  silodosin TAKE 1 CAPSULE 1 TIME DAILY   tadalafil 5 MG tablet Commonly known as:  CIALIS Take by mouth.   TOUJEO SOLOSTAR 300 UNIT/ML Sopn Generic drug:  Insulin Glargine Inject 50 Units into the skin 3 (three) times daily before meals.   VITAMIN D-1000 MAX ST 1000 units tablet Generic drug:  Cholecalciferol Take by mouth.       Allergies:  Allergies  Allergen Reactions  . Vicodin [Hydrocodone-Acetaminophen] Nausea Only    Family History: Family History  Problem Relation Age of Onset  .  Hypertension Mother   . Hypertension Brother   . Prostate cancer Neg Hx   . Kidney cancer Neg Hx   . Kidney disease Neg Hx     Social History:  reports that he has quit smoking. He has never used smokeless tobacco. He reports that he drinks alcohol. He reports that he does not use drugs.  ROS: UROLOGY Frequent Urination?: No Hard to postpone urination?: No Burning/pain with urination?: No Get up at night to urinate?: No Leakage of urine?: No Urine stream starts and stops?: No Trouble starting stream?: No Do you have to strain to urinate?: No Blood in urine?: No Urinary tract infection?: No Sexually transmitted disease?: No Injury to kidneys or bladder?: No Painful intercourse?: No Weak stream?: No Erection problems?: Yes Penile pain?: No  Gastrointestinal Nausea?: No Vomiting?: No Indigestion/heartburn?: No Diarrhea?: No Constipation?: No  Constitutional Fever: No Night sweats?: No Weight loss?: No Fatigue?: No  Skin Skin rash/lesions?: No Itching?: No  Eyes Blurred vision?: No Double vision?: No  Ears/Nose/Throat Sore throat?: No Sinus problems?: No  Hematologic/Lymphatic Swollen glands?: No Easy bruising?: No  Cardiovascular Leg swelling?: No Chest pain?: No  Respiratory Cough?: No Shortness of breath?: No  Endocrine Excessive thirst?: No  Musculoskeletal Back pain?: No Joint pain?: No  Neurological Headaches?: No Dizziness?: No  Psychologic Depression?: No Anxiety?: No  Physical Exam: BP (!) 152/63   Pulse 77   Ht 5\' 8"  (1.727 m)   Wt 181 lb (82.1 kg)   BMI 27.52 kg/m   Constitutional:  Alert and oriented, No acute distress. HEENT: [place] AT, moist mucus membranes.  Trachea midline, no masses. Cardiovascular: No clubbing, cyanosis, or edema. Respiratory: Normal respiratory effort, no increased work of breathing. GI: Abdomen is soft, nontender, nondistended, no abdominal masses.  No appreciable inguinal hernia when examined in a  standing position with Valsalva. GU: Bilateral descended testicles without masses. Circumcised phallus with orthotopic meatus. Skin: No rashes, bruises or suspicious lesions. Lymph: No cervical or inguinal adenopathy. Neurologic: Grossly intact, no focal deficits, moving all 4 extremities. Psychiatric: Normal mood and affect.  Urinalysis    Component Value Date/Time   COLORURINE STRAW (A) 05/07/2016 1436   APPEARANCEUR CLEAR 05/07/2016 1436   LABSPEC 1.020 05/07/2016 1436   PHURINE 5.0 05/07/2016 1436   GLUCOSEU 250 (A) 05/07/2016 1436   HGBUR NEGATIVE 05/07/2016 1436   BILIRUBINUR NEGATIVE 05/07/2016 1436   KETONESUR NEGATIVE 05/07/2016 1436   PROTEINUR NEGATIVE 05/07/2016 1436   NITRITE NEGATIVE 05/07/2016 1436   LEUKOCYTESUR NEGATIVE 05/07/2016 1436    Pertinent Imaging: Study Result   CLINICAL DATA:  Right-sided scrotal pain.  EXAM: SCROTAL ULTRASOUND  DOPPLER ULTRASOUND OF THE TESTICLES  TECHNIQUE: Complete ultrasound examination of the testicles, epididymis, and other scrotal structures was performed. Color and spectral Doppler ultrasound were also utilized to evaluate blood flow to the testicles.  COMPARISON:  None.  FINDINGS: Right testicle  Measurements: 4.9 x 2.4 x 3.2 cm. No mass or microlithiasis visualized.  Left testicle  Measurements: 4.3 x 2.4 x 2.3 cm. No mass or microlithiasis visualized.  Right epididymis:  Normal in size and appearance.  Left epididymis: Small cysts largest measuring 6 mm. Otherwise unremarkable.  Hydrocele:  None visualized.  Varicocele:  None visualized.  Pulsed Doppler interrogation of both testes demonstrates normal low resistance arterial and venous waveforms bilaterally.  IMPRESSION: 1. No acute findings. No testicular mass, torsion or evidence of epididymitis/orchitis. 2. Small left epididymal cysts.  No other abnormalities.   Electronically Signed   By: Lajean Manes M.D.   On:  05/07/2016 16:35    Scrotal show reviewed personally today.  Assessment & Plan:    1. 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 Physical exam benign.   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  Options including cord block and physical therapy were discussed. Information about chronic scrotal pain given today. Diagnosis was discussed in detail. Outside of these options, treatment is limited.  2. BPH (benign prostatic hyperplasia) Continue Cialis 5 mg daily, urinary symptoms well controlled on this medication  3. Erectile dysfunction, unspecified erectile dysfunction type Refractory erectile dysfunction despite daily Cialis.  Discussed alternative options including injections, vacuum erection device, and implant discussed today. He interested in pursuing these at this time.  Return in about 6 months (around 01/15/2017).  Hollice Espy, MD  [facility] [address] [place], [place] [address] [phone]

## 2016-07-20 ENCOUNTER — Ambulatory Visit
Admission: EM | Admit: 2016-07-20 | Discharge: 2016-07-20 | Disposition: A | Discharge status: Home / Self Care | Payer: BLUE CROSS/BLUE SHIELD | Attending: Family Medicine | Admitting: Family Medicine

## 2016-07-20 ENCOUNTER — Ambulatory Visit (INDEPENDENT_AMBULATORY_CARE_PROVIDER_SITE_OTHER): Payer: BLUE CROSS/BLUE SHIELD

## 2016-07-20 ENCOUNTER — Encounter: Payer: Self-pay | Admitting: Gynecology

## 2016-07-20 DIAGNOSIS — S92901A Unspecified fracture of right foot, initial encounter for closed fracture: Secondary | ICD-10-CM

## 2016-07-20 MED ORDER — NAPROXEN 500 MG PO TABS: 500.0000 mg | ORAL_TABLET | Freq: Two times a day (BID) | ORAL | 0 refills | Status: DC

## 2016-07-20 NOTE — ED Triage Notes (Signed)
Per patient had previous surgey on his right ankle. Patient stated while moving to his new home x yesterday and taking furniture upstair heard a pop in right ankle.

## 2016-07-20 NOTE — ED Provider Notes (Signed)
CSN: [id number]     Arrival date & time 07/20/16  1137 History   First MD Initiated Contact with Patient 07/20/16 1212     Chief Complaint  Patient presents with  . Ankle Pain   (Consider location/radiation/quality/duration/timing/severity/associated sxs/prior Treatment) Pt was moving yesterday and heard a pop to his rt ankle began having swelling and pain. States that he had an injury in 1980 to the same ankle but none since. Not able to apply pressure to foot. Has not taken anything for pain this am. Strong pulses warm in color.       Past Medical History:  Diagnosis Date  . Allergic rhinitis   . BPH (benign prostatic hyperplasia)   . Cervical disc disease   . Diabetes mellitus without complication ([place])   . Erectile disorder 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   . GERD (gastroesophageal reflux disease)   . Hyperlipidemia   . Hypertension    Past Surgical History:  Procedure Laterality Date  . ANKLE SURGERY    . COLONOSCOPY WITH PROPOFOL N/A 08/02/2015   Procedure: COLONOSCOPY WITH PROPOFOL;  Surgeon: Hulen Luster, MD;  Location: [facility] ENDOSCOPY;  Service: Gastroenterology;  Laterality: N/A;  . SHOULDER SURGERY     Family History  Problem Relation Age of Onset  . Hypertension Mother   . Hypertension Brother   . Prostate cancer Neg Hx   . Kidney cancer Neg Hx   . Kidney disease Neg Hx    Social History  Substance Use Topics  . Smoking status: Former Research scientist (life sciences)  . Smokeless tobacco: Never Used  . Alcohol use Yes    Review of Systems  Constitutional: Negative.   Musculoskeletal: Positive for joint swelling.  Skin: Negative.        Swelling to rt anterior area, strong pedal and distal pulses. Warm touch. Able to move distal digits.     Allergies  Vicodin [hydrocodone-acetaminophen]  Home Medications   Prior to Admission medications   Medication Sig Start Date End Date Taking? Authorizing Provider  amLODipine (NORVASC) 5 MG tablet Take 5 mg by mouth daily.   Yes  Historical Provider, MD  Cholecalciferol (VITAMIN D-1000 MAX ST) 1000 units tablet Take by mouth.   Yes Historical Provider, MD  cyanocobalamin (,VITAMIN B-12,) 1000 MCG/ML injection Inject into the muscle. 02/07/16 02/01/17 Yes Historical Provider, MD  fluticasone (FLONASE) 50 MCG/ACT nasal spray Place into both nostrils daily.   Yes Historical Provider, MD  gabapentin (NEURONTIN) 300 MG capsule TK 1 C PO TID 06/15/16  Yes Historical Provider, MD  insulin aspart (NOVOLOG) 100 UNIT/ML FlexPen Inject upto 70u per day 05/29/16  Yes Historical Provider, MD  Insulin Glargine (TOUJEO SOLOSTAR) 300 UNIT/ML SOPN Inject 50 Units into the skin 3 (three) times daily before meals.   Yes Historical Provider, MD  lisinopril (PRINIVIL,ZESTRIL) 40 MG tablet Take 40 mg by mouth daily.   Yes Historical Provider, MD  omeprazole (PRILOSEC) 20 MG capsule Take 20 mg by mouth daily.   Yes Historical Provider, MD  [facility] DELICA LANCETS [id number] MISC by Does not apply route.   Yes Historical Provider, MD  RAPAFLO 8 MG CAPS capsule TAKE 1 CAPSULE 1 TIME DAILY 11/15/15  Yes Shannon A McGowan, PA-C  tadalafil (CIALIS) 5 MG tablet Take by mouth.   Yes Historical Provider, MD   Meds Ordered and Administered this Visit  Medications - No data to display  BP (!) 122/57 (BP Location: Left Arm)   Pulse 65   Temp 98.7 F (  37.1 C) (Oral)   Resp 16   Ht 5\' 8"  (1.727 m)   Wt 180 lb (81.6 kg)   SpO2 99%   BMI 27.37 kg/m  No data found.   Physical Exam  Constitutional: He is oriented to person, place, and time. He appears well-nourished.  Cardiovascular: Normal rate.   Pulmonary/Chest: Effort normal.  Musculoskeletal: He exhibits tenderness.  +2 edema to rt anterior ankle, full rom to distal digits, strong pulses.   Neurological: He is alert and oriented to person, place, and time. He has normal reflexes.  Skin: Skin is warm and dry. Capillary refill takes less than 2 seconds.    Urgent Care Course   Clinical Course     Procedures (including critical care time)  Labs Review Labs Reviewed - No data to display  Imaging Review No results found.   Visual Acuity Review  Right Eye Distance:   Left Eye Distance:   Bilateral Distance:    Right Eye Near:   Left Eye Near:    Bilateral Near:         MDM  No diagnosis found. Rest area May use ice if needed Take pain meds PRN  F/u with ortho for fracture Wear boot until seen by ortho.you have a fracture to the foot. We will give you a CD with your xrays on file.      Melanee Left, NP 07/20/16 1253

## 2016-11-27 ENCOUNTER — Other Ambulatory Visit: Payer: Self-pay | Admitting: Urology

## 2016-11-27 DIAGNOSIS — N400 Enlarged prostate without lower urinary tract symptoms: Secondary | ICD-10-CM

## 2017-02-20 ENCOUNTER — Other Ambulatory Visit: Payer: Self-pay | Admitting: Urology

## 2017-02-20 DIAGNOSIS — N400 Enlarged prostate without lower urinary tract symptoms: Secondary | ICD-10-CM

## 2017-02-20 NOTE — Telephone Encounter (Signed)
Pt pharmacy sent a refill request for rapaflo. Refill denied as pt needs an appt per Dr. Erlene Quan dictation.

## 2017-02-23 ENCOUNTER — Emergency Department: Payer: BLUE CROSS/BLUE SHIELD

## 2017-02-23 ENCOUNTER — Emergency Department
Admission: EM | Admit: 2017-02-23 | Discharge: 2017-02-23 | Disposition: A | Discharge status: Home / Self Care | Payer: BLUE CROSS/BLUE SHIELD | Attending: Emergency Medicine | Admitting: Emergency Medicine

## 2017-02-23 DIAGNOSIS — I10 Essential (primary) hypertension: Secondary | ICD-10-CM | POA: Insufficient documentation

## 2017-02-23 DIAGNOSIS — Z79899 Other long term (current) drug therapy: Secondary | ICD-10-CM | POA: Insufficient documentation

## 2017-02-23 DIAGNOSIS — S93601A Unspecified sprain of right foot, initial encounter: Secondary | ICD-10-CM | POA: Diagnosis not present

## 2017-02-23 DIAGNOSIS — Y999 Unspecified external cause status: Secondary | ICD-10-CM | POA: Diagnosis not present

## 2017-02-23 DIAGNOSIS — E119 Type 2 diabetes mellitus without complications: Secondary | ICD-10-CM | POA: Diagnosis not present

## 2017-02-23 DIAGNOSIS — W1840XA Slipping, tripping and stumbling without falling, unspecified, initial encounter: Secondary | ICD-10-CM | POA: Insufficient documentation

## 2017-02-23 DIAGNOSIS — Z794 Long term (current) use of insulin: Secondary | ICD-10-CM | POA: Insufficient documentation

## 2017-02-23 DIAGNOSIS — Y9389 Activity, other specified: Secondary | ICD-10-CM | POA: Diagnosis not present

## 2017-02-23 DIAGNOSIS — Y929 Unspecified place or not applicable: Secondary | ICD-10-CM | POA: Diagnosis not present

## 2017-02-23 DIAGNOSIS — Z87891 Personal history of nicotine dependence: Secondary | ICD-10-CM | POA: Insufficient documentation

## 2017-02-23 DIAGNOSIS — S99921A Unspecified injury of right foot, initial encounter: Secondary | ICD-10-CM | POA: Diagnosis present

## 2017-02-23 NOTE — ED Provider Notes (Signed)
[facility] Emergency Department Provider Note ____________________________________________  Time seen: Approximately 3:45 PM  I have reviewed the triage vital signs and the nursing notes.   HISTORY  Chief Complaint Ankle Pain    HPI Brian Farmer is a 61 y.o. male who presents to the emergency department for evaluation of right ankle pain. He tripped while going down stairs and twisted the ankle.He reports injury to the same about 6 months ago and was evaluated for metatarsal fractures by Dr. Vickki Muff. Symptoms are similar, but pain is a bit worse in the ankle this time. Incident occurred prior to arrival. No alleviating measures attempted for this complaint.  Past Medical History:  Diagnosis Date  . Allergic rhinitis   . BPH (benign prostatic hyperplasia)   . Cervical disc disease   . Diabetes mellitus without complication ([place])   . Erectile disorder 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   . GERD (gastroesophageal reflux disease)   . Hyperlipidemia   . Hypertension     Patient Active Problem List   Diagnosis Date Noted  . Cervical radiculitis 06/21/2015  . DDD (degenerative disc disease), cervical 06/21/2015  . Enlarged prostate with urinary obstruction 01/19/2015  . Cervical back pain with evidence of disc disease 01/19/2015  . Essential hypertension 01/19/2015  . Gastroesophageal reflux disease without esophagitis 01/19/2015  . Hyperlipidemia, mixed 01/19/2015  . Type 2 diabetes mellitus, uncontrolled ([place]) 01/19/2015    Past Surgical History:  Procedure Laterality Date  . ANKLE SURGERY    . COLONOSCOPY WITH PROPOFOL N/A 08/02/2015   Procedure: COLONOSCOPY WITH PROPOFOL;  Surgeon: Hulen Luster, MD;  Location: [facility] ENDOSCOPY;  Service: Gastroenterology;  Laterality: N/A;  . SHOULDER SURGERY      Prior to Admission medications   Medication Sig Start Date End Date Taking? Authorizing Provider  amLODipine (NORVASC) 5 MG tablet Take 5 mg by  mouth daily.    Historical Provider, MD  Cholecalciferol (VITAMIN D-1000 MAX ST) 1000 units tablet Take by mouth.    Historical Provider, MD  fluticasone (FLONASE) 50 MCG/ACT nasal spray Place into both nostrils daily.    Historical Provider, MD  gabapentin (NEURONTIN) 300 MG capsule TK 1 C PO TID 06/15/16   Historical Provider, MD  insulin aspart (NOVOLOG) 100 UNIT/ML FlexPen Inject upto 70u per day 05/29/16   Historical Provider, MD  Insulin Glargine (TOUJEO SOLOSTAR) 300 UNIT/ML SOPN Inject 50 Units into the skin 3 (three) times daily before meals.    Historical Provider, MD  lisinopril (PRINIVIL,ZESTRIL) 40 MG tablet Take 40 mg by mouth daily.    Historical Provider, MD  naproxen (NAPROSYN) 500 MG tablet Take 1 tablet (500 mg total) by mouth 2 (two) times daily. 07/20/16   Melanee Left, NP  omeprazole (PRILOSEC) 20 MG capsule Take 20 mg by mouth daily.    Historical Provider, MD  [facility] DELICA LANCETS 96E MISC by Does not apply route.    Historical Provider, MD  RAPAFLO 8 MG CAPS capsule TAKE 1 CAPSULE BY MOUTH EVERY DAY 11/27/16   Hollice Espy, MD  tadalafil (CIALIS) 5 MG tablet Take by mouth.    Historical Provider, MD    Allergies Naproxen and Vicodin [hydrocodone-acetaminophen]  Family History  Problem Relation Age of Onset  . Hypertension Mother   . Hypertension Brother   . Prostate cancer Neg Hx   . Kidney cancer Neg Hx   . Kidney disease Neg Hx     Social History Social History  Substance Use Topics  .  Smoking status: Former Research scientist (life sciences)  . Smokeless tobacco: Never Used  . Alcohol use Yes    Review of Systems Constitutional: No recent illness. Cardiovascular: Denies chest pain or palpitations. Respiratory: Denies shortness of breath. Musculoskeletal: Pain in right foot and ankle. Skin: Negative for rash, wound, lesion. Positive for swelling over the right lateral foot/ankle. Neurological: Negative for focal weakness or  numbness.  ____________________________________________   PHYSICAL EXAM:  VITAL SIGNS: ED Triage Vitals  Enc Vitals Group     BP 02/23/17 1433 100/70     Pulse Rate 02/23/17 1433 61     Resp 02/23/17 1433 18     Temp 02/23/17 1433 98.7 F (37.1 C)     Temp Source 02/23/17 1433 Oral     SpO2 02/23/17 1433 99 %     Weight 02/23/17 1433 178 lb (80.7 kg)     Height 02/23/17 1433 5\' 8"  (1.727 m)     Head Circumference --      Peak Flow --      Pain Score 02/23/17 1437 6     Pain Loc --      Pain Edu? --      Excl. in [place]? --     Constitutional: Alert and oriented. Well appearing and in no acute distress. Eyes: Conjunctivae are normal. EOMI. Head: Atraumatic. Neck: No stridor.  Respiratory: Normal respiratory effort.   Musculoskeletal: Decreased ROM of the right ankle secondary to swelling and pain. [place] Ankle Rules are negative.  Significant swelling and early ecchymosis is present on and under the right lateral malleolus and extending to the dorsum of the foot over the 3rd-5th proximal metatarsals. Neurologic:  Normal speech and language. No gross focal neurologic deficits are appreciated. Speech is normal. No gait instability. Skin: See musculoskeletal comments Psychiatric: Mood and affect are normal. Speech and behavior are normal.  ____________________________________________   LABS (all labs ordered are listed, but only abnormal results are displayed)  Labs Reviewed - No data to display ____________________________________________  RADIOLOGY  Right foot and ankle both negative for acute bony abnormality per radiology. I, Sherrie George, personally viewed and evaluated these images (plain radiographs) as part of my medical decision making, as well as reviewing the written report by the radiologist.  ___________________________________________   PROCEDURES  Procedure(s) performed: Velcro ankle stirrup splint applied by ER tech. Patient neurovascularly intact post  application.   ____________________________________________   INITIAL IMPRESSION / ASSESSMENT AND PLAN / ED COURSE  61 year old male presenting to the emergency department for evaluation after twist injury earlier today while coming down a set of stairs. No indication of new fracture on images. He will follow up with Dr. Vickki Muff for repeat images in about a week. RICE was advised. He will take Aleeve or Advil for pain if needed. He was also given crutches and encouraged the least amount of weight bearing possible for the next several days.  Pertinent labs & imaging results that were available during my care of the patient were reviewed by me and considered in my medical decision making (see chart for details).  _________________________________________   FINAL CLINICAL IMPRESSION(S) / ED DIAGNOSES  Final diagnoses:  Sprain of right foot, initial encounter    New Prescriptions   No medications on file    If controlled substance prescribed during this visit, 12 month history viewed on the [place] prior to issuing an initial prescription for Schedule II or III opiod.    Victorino Dike, FNP 02/23/17 [phone]  Schuyler Amor, MD 02/23/17 2138

## 2017-02-23 NOTE — Discharge Instructions (Signed)
Avoid weight bearing until the swelling and pain have gone or until advised by Dr. Vickki Muff. Take Aleeve 2 times per day or Advil every 6 hours for pain. Return to the ER for symptoms that change or worsen if unable to schedule an appointment.

## 2017-02-23 NOTE — ED Notes (Signed)
Patient splinted and crutches fitted and gait training by ER tech. Patient left without instructions.

## 2017-02-23 NOTE — ED Triage Notes (Addendum)
Pt states tripped going down steps and twisted R ankle. Swelling noted. Skin warm and dry. Alert and oriented. Pedal pulses felt. Denies hitting any other part of body.

## 2017-03-03 ENCOUNTER — Encounter: Payer: Self-pay | Admitting: *Deleted

## 2017-03-04 ENCOUNTER — Encounter
Admission: RE | Disposition: A | Discharge status: Home / Self Care | Payer: Self-pay | Source: Ambulatory Visit | Attending: Unknown Physician Specialty

## 2017-03-04 ENCOUNTER — Ambulatory Visit: Payer: BLUE CROSS/BLUE SHIELD | Admitting: Anesthesiology

## 2017-03-04 ENCOUNTER — Encounter: Payer: Self-pay | Admitting: *Deleted

## 2017-03-04 ENCOUNTER — Ambulatory Visit
Admission: RE | Admit: 2017-03-04 | Discharge: 2017-03-04 | Disposition: A | Discharge status: Home / Self Care | Payer: BLUE CROSS/BLUE SHIELD | Source: Ambulatory Visit | Attending: Unknown Physician Specialty | Admitting: Unknown Physician Specialty

## 2017-03-04 DIAGNOSIS — K640 First degree hemorrhoids: Secondary | ICD-10-CM | POA: Insufficient documentation

## 2017-03-04 DIAGNOSIS — E119 Type 2 diabetes mellitus without complications: Secondary | ICD-10-CM | POA: Diagnosis not present

## 2017-03-04 DIAGNOSIS — R12 Heartburn: Secondary | ICD-10-CM | POA: Diagnosis present

## 2017-03-04 DIAGNOSIS — Z87891 Personal history of nicotine dependence: Secondary | ICD-10-CM | POA: Insufficient documentation

## 2017-03-04 DIAGNOSIS — Z1509 Genetic susceptibility to other malignant neoplasm: Secondary | ICD-10-CM | POA: Diagnosis not present

## 2017-03-04 DIAGNOSIS — Z79899 Other long term (current) drug therapy: Secondary | ICD-10-CM | POA: Diagnosis not present

## 2017-03-04 DIAGNOSIS — K319 Disease of stomach and duodenum, unspecified: Secondary | ICD-10-CM | POA: Insufficient documentation

## 2017-03-04 DIAGNOSIS — K621 Rectal polyp: Secondary | ICD-10-CM | POA: Diagnosis not present

## 2017-03-04 DIAGNOSIS — K210 Gastro-esophageal reflux disease with esophagitis: Secondary | ICD-10-CM | POA: Insufficient documentation

## 2017-03-04 DIAGNOSIS — I10 Essential (primary) hypertension: Secondary | ICD-10-CM | POA: Insufficient documentation

## 2017-03-04 DIAGNOSIS — Z794 Long term (current) use of insulin: Secondary | ICD-10-CM | POA: Insufficient documentation

## 2017-03-04 DIAGNOSIS — N400 Enlarged prostate without lower urinary tract symptoms: Secondary | ICD-10-CM | POA: Diagnosis not present

## 2017-03-04 HISTORY — DX: Acute pancreatitis without necrosis or infection, unspecified: K85.90

## 2017-03-04 HISTORY — DX: Genetic susceptibility to other malignant neoplasm: Z15.09

## 2017-03-04 HISTORY — DX: Other cervical disc degeneration, unspecified cervical region: M50.30

## 2017-03-04 HISTORY — PX: ESOPHAGOGASTRODUODENOSCOPY (EGD) WITH PROPOFOL: SHX5813

## 2017-03-04 HISTORY — PX: COLONOSCOPY WITH PROPOFOL: SHX5780

## 2017-03-04 LAB — GLUCOSE, CAPILLARY: GLUCOSE-CAPILLARY: 76 mg/dL (ref 65–99)

## 2017-03-04 SURGERY — ESOPHAGOGASTRODUODENOSCOPY (EGD) WITH PROPOFOL
Anesthesia: General

## 2017-03-04 MED ORDER — LIDOCAINE HCL (PF) 1 % IJ SOLN: 2.0000 mL | Freq: Once | INTRAMUSCULAR | Status: DC

## 2017-03-04 MED ORDER — PROPOFOL 500 MG/50ML IV EMUL
INTRAVENOUS | Status: AC
Start: 2017-03-04 — End: 2017-03-04
  Filled 2017-03-04: qty 50

## 2017-03-04 MED ORDER — PROPOFOL 10 MG/ML IV BOLUS
INTRAVENOUS | Status: AC
  Filled 2017-03-04: qty 20

## 2017-03-04 MED ORDER — KETAMINE HCL 10 MG/ML IJ SOLN
INTRAMUSCULAR | Status: DC | PRN
  Administered 2017-03-04 (×3): 5 mg via INTRAVENOUS

## 2017-03-04 MED ORDER — KETAMINE HCL 50 MG/ML IJ SOLN
INTRAMUSCULAR | Status: AC
  Filled 2017-03-04: qty 10

## 2017-03-04 MED ORDER — BUTAMBEN-TETRACAINE-BENZOCAINE 2-2-14 % EX AERO
INHALATION_SPRAY | CUTANEOUS | Status: AC
  Filled 2017-03-04: qty 20

## 2017-03-04 MED ORDER — LIDOCAINE HCL (PF) 1 % IJ SOLN
INTRAMUSCULAR | Status: AC
  Administered 2017-03-04: 0.3 mL
  Filled 2017-03-04: qty 2

## 2017-03-04 MED ORDER — SODIUM CHLORIDE 0.9 % IV SOLN
INTRAVENOUS | Status: DC
  Administered 2017-03-04: 08:00:00 via INTRAVENOUS

## 2017-03-04 MED ORDER — SODIUM CHLORIDE 0.9 % IV SOLN
INTRAVENOUS | Status: DC
  Administered 2017-03-04: 1000 mL via INTRAVENOUS

## 2017-03-04 MED ORDER — PROPOFOL 10 MG/ML IV BOLUS
INTRAVENOUS | Status: DC | PRN
  Administered 2017-03-04: 700 mg via INTRAVENOUS

## 2017-03-04 NOTE — Anesthesia Preprocedure Evaluation (Signed)
Anesthesia Evaluation  Patient identified by MRN, date of birth, ID band Patient awake    Reviewed: Allergy & Precautions, H&P , NPO status , Patient's Chart, lab work & pertinent test results, reviewed documented beta blocker date and time   History of Anesthesia Complications Negative for: history of anesthetic complications  Airway Mallampati: II  TM Distance: >3 FB Neck ROM: full    Dental no notable dental hx. (+) Teeth Intact, Caps   Pulmonary neg shortness of breath, neg sleep apnea, neg COPD, neg recent URI, former smoker,    Pulmonary exam normal breath sounds clear to auscultation       Cardiovascular Exercise Tolerance: Good hypertension, Pt. on medications (-) angina(-) CAD, (-) Past MI, (-) Cardiac Stents and (-) CABG Normal cardiovascular exam(-) dysrhythmias (-) Valvular Problems/Murmurs Rhythm:regular Rate:Normal     Neuro/Psych  Neuromuscular disease negative neurological ROS  negative psych ROS   GI/Hepatic Neg liver ROS, GERD  Medicated and Controlled,  Endo/Other  diabetes, Well Controlled, Type 2, Insulin Dependent  Renal/GU negative Renal ROS  negative genitourinary   Musculoskeletal   Abdominal   Peds negative pediatric ROS (+)  Hematology negative hematology ROS (+)   Anesthesia Other Findings Past Medical History: No date: Allergic rhinitis No date: BPH (benign prostatic hyperplasia) No date: Cervical disc disease No date: DDD (degenerative disc disease), cervical No date: Diabetes mellitus without complication (H[facility]) No date: Erectile disorder due to medical condition in * No date: GERD (gastroesophageal reflux disease) No date: Hyperlipidemia No date: Hypertension No date: Lynch syndrome No date: Pancreatitis  Reproductive/Obstetrics negative OB ROS                             Anesthesia Physical  Anesthesia Plan  ASA: III  Anesthesia Plan: General    Post-op Pain Management:    Induction:   Airway Management Planned:   Additional Equipment:   Intra-op Plan:   Post-operative Plan:   Informed Consent: I have reviewed the patients History and Physical, chart, labs and discussed the procedure including the risks, benefits and alternatives for the proposed anesthesia with the patient or authorized representative who has indicated his/her understanding and acceptance.   Dental Advisory Given  Plan Discussed with: Anesthesiologist, CRNA and Surgeon  Anesthesia Plan Comments:         Anesthesia Quick Evaluation

## 2017-03-04 NOTE — Op Note (Signed)
[facility] Gastroenterology Patient Name: Brian Farmer Procedure Date: 03/04/2017 7:22 AM MRN: [id number] Account #: [id number] Date of Birth: 04/07/1956 Admit Type: Outpatient Age: 61 Room: [facility] ENDO ROOM 4 Gender: Male Note Status: Finalized Procedure:            Colonoscopy Indications:          Lucy Chris syndrome Providers:            Manya Silvas, MD Complications:        No immediate complications. Procedure:            Pre-Anesthesia Assessment:                       - After reviewing the risks and benefits, the patient                        was deemed in satisfactory condition to undergo the                        procedure.                       After obtaining informed consent, the colonoscope was                        passed under direct vision. Throughout the procedure,                        the patient's blood pressure, pulse, and oxygen                        saturations were monitored continuously. The                        Colonoscope was introduced through the anus and                        advanced to the the cecum, identified by appendiceal                        orifice and ileocecal valve. The colonoscopy was                        performed without difficulty. The patient tolerated the                        procedure well. The quality of the bowel preparation                        was good. Findings:      A diminutive polyp was found in the rectum. The polyp was sessile. The       polyp was removed with a jumbo cold forceps. Resection and retrieval       were complete.      Internal hemorrhoids were found during endoscopy. The hemorrhoids were       small and Grade I (internal hemorrhoids that do not prolapse).      The exam was otherwise without abnormality. Impression:           - One diminutive polyp in the rectum, removed with a  jumbo cold forceps. Resected and retrieved.                       -  Internal hemorrhoids.                       - The examination was otherwise normal. Recommendation:       - Await pathology results. Manya Silvas, MD 03/04/2017 8:23:36 AM This report has been signed electronically. Number of Addenda: 0 Note Initiated On: 03/04/2017 7:22 AM Scope Withdrawal Time: 0 hours 8 minutes 50 seconds  Total Procedure Duration: 0 hours 17 minutes 28 seconds       [facility]

## 2017-03-04 NOTE — H&P (Signed)
Primary Care Physician:  [facility], MD Primary Gastroenterologist:  Dr. Vira Agar  Pre-Procedure History & Physical: HPI:  Brian Farmer is a 61 y.o. male is here for an endoscopy and colonoscopy.   Past Medical History:  Diagnosis Date  . Allergic rhinitis   . BPH (benign prostatic hyperplasia)   . Cervical disc disease   . DDD (degenerative disc disease), cervical   . Diabetes mellitus without complication ([place])   . Erectile disorder 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   . GERD (gastroesophageal reflux disease)   . Hyperlipidemia   . Hypertension   . Lynch syndrome   . Pancreatitis     Past Surgical History:  Procedure Laterality Date  . ANKLE SURGERY    . COLONOSCOPY WITH PROPOFOL N/A 08/02/2015   Procedure: COLONOSCOPY WITH PROPOFOL;  Surgeon: Hulen Luster, MD;  Location: [facility] ENDOSCOPY;  Service: Gastroenterology;  Laterality: N/A;  . SHOULDER SURGERY    . TRIGGER FINGER RELEASE Bilateral     Prior to Admission medications   Medication Sig Start Date End Date Taking? Authorizing Provider  amLODipine (NORVASC) 5 MG tablet Take 5 mg by mouth daily.    Historical Provider, MD  Cholecalciferol (VITAMIN D-1000 MAX ST) 1000 units tablet Take by mouth.    Historical Provider, MD  fluticasone (FLONASE) 50 MCG/ACT nasal spray Place into both nostrils daily.    Historical Provider, MD  insulin aspart (NOVOLOG) 100 UNIT/ML FlexPen Inject upto 70u per day 05/29/16   Historical Provider, MD  Insulin Glargine (TOUJEO SOLOSTAR) 300 UNIT/ML SOPN Inject 50 Units into the skin 3 (three) times daily before meals.    Historical Provider, MD  lisinopril (PRINIVIL,ZESTRIL) 40 MG tablet Take 40 mg by mouth daily.    Historical Provider, MD  omeprazole (PRILOSEC) 20 MG capsule Take 20 mg by mouth daily.    Historical Provider, MD  [facility] DELICA LANCETS 34J MISC by Does not apply route.    Historical Provider, MD  RAPAFLO 8 MG CAPS capsule TAKE 1 CAPSULE BY MOUTH EVERY DAY  11/27/16   Hollice Espy, MD  tadalafil (CIALIS) 5 MG tablet Take by mouth.    Historical Provider, MD    Allergies as of 01/12/2017 - Review Complete 07/20/2016  Allergen Reaction Noted  . Naproxen Nausea Only 07/20/2016  . Vicodin [hydrocodone-acetaminophen] Nausea Only 08/01/2015    Family History  Problem Relation Age of Onset  . Hypertension Mother   . Hypertension Brother   . Prostate cancer Neg Hx   . Kidney cancer Neg Hx   . Kidney disease Neg Hx     Social History   Social History  . Marital status: Married    Spouse name: N/A  . Number of children: N/A  . Years of education: N/A   Occupational History  . Not on file.   Social History Main Topics  . Smoking status: Former Research scientist (life sciences)  . Smokeless tobacco: Never Used  . Alcohol use Yes     Comment: occasional  . Drug use: No  . Sexual activity: Not on file   Other Topics Concern  . Not on file   Social History Narrative  . No narrative on file    Review of Systems: See HPI, otherwise negative ROS  Physical Exam: BP (!) 160/75   Pulse 73   Temp 97.7 F (36.5 C) (Tympanic)   Resp 16   Ht 5\' 8"  (1.727 m)   Wt 79.8 kg (176 lb)   SpO2 99%  BMI 26.76 kg/m  General:   Alert,  pleasant and cooperative in NAD Head:  Normocephalic and atraumatic. Neck:  Supple; no masses or thyromegaly. Lungs:  Clear throughout to auscultation.    Heart:  Regular rate and rhythm. Abdomen:  Soft, nontender and nondistended. Normal bowel sounds, without guarding, and without rebound.   Neurologic:  Alert and  oriented x4;  grossly normal neurologically.  Impression/Plan: Brian Farmer is here for an endoscopy and colonoscopy to be performed for GERD and Muir-Torre syndrome  Risks, benefits, limitations, and alternatives regarding  endoscopy and colonoscopy have been reviewed with the patient.  Questions have been answered.  All parties agreeable.   Gaylyn Cheers, MD  03/04/2017, 7:20 AM

## 2017-03-04 NOTE — Transfer of Care (Signed)
Immediate Anesthesia Transfer of Care Note  Patient: Brian Farmer  Procedure(s) Performed: Procedure(s): ESOPHAGOGASTRODUODENOSCOPY (EGD) WITH PROPOFOL (N/A) COLONOSCOPY WITH PROPOFOL (N/A)  Patient Location: PACU  Anesthesia Type:General  Level of Consciousness: awake, alert  and oriented  Airway & Oxygen Therapy: Patient Spontanous Breathing and Patient connected to nasal cannula oxygen  Post-op Assessment: Report given to RN and Post -op Vital signs reviewed and stable  Post vital signs: Reviewed and stable  Last Vitals:  Vitals:   03/04/17 0704  BP: (!) 160/75  Pulse: 73  Resp: 16  Temp: 36.5 C    Last Pain:  Vitals:   03/04/17 0704  TempSrc: Tympanic         Complications: No apparent anesthesia complications

## 2017-03-04 NOTE — Anesthesia Post-op Follow-up Note (Cosign Needed)
Anesthesia QCDR form completed.        

## 2017-03-04 NOTE — Op Note (Signed)
[facility] Gastroenterology Patient Name: Brian Farmer Procedure Date: 03/04/2017 7:23 AM MRN: [id number] Account #: [id number] Date of Birth: [birth year] Admit Type: Outpatient Age: 61 Room: [facility] ENDO ROOM 4 Gender: Male Note Status: Finalized Procedure:            Upper GI endoscopy Indications:          Heartburn Providers:            Manya Silvas, MD Referring MD:         [name] (Referring MD) Medicines:            Propofol per Anesthesia Complications:        No immediate complications. Procedure:            Pre-Anesthesia Assessment:                       - After reviewing the risks and benefits, the patient                        was deemed in satisfactory condition to undergo the                        procedure.                       After obtaining informed consent, the endoscope was                        passed under direct vision. Throughout the procedure,                        the patient's blood pressure, pulse, and oxygen                        saturations were monitored continuously. The Endoscope                        was introduced through the mouth, and advanced to the                        second part of duodenum. The upper GI endoscopy was                        accomplished without difficulty. The patient tolerated                        the procedure well. Findings:      LA Grade A (one or more mucosal breaks less than 5 mm, not extending       between tops of 2 mucosal folds) esophagitis with no bleeding was found       40 cm from the incisors.      Patchy minimal inflammation characterized by erythema and granularity       was found in the stomach. Biopsies were taken with a cold forceps for       histology. Biopsies were taken with a cold forceps for Helicobacter       pylori testing.      Tiny Flecks of blood seen in a few areas but when washed off no erosions       or ulcers seen.      The examined duodenum  was  normal. Impression:           - LA Grade A reflux esophagitis.                       - Gastritis. Biopsied.                       - Normal examined duodenum. Recommendation:       Await biopsies, take medicine and do colonoscopy now.                       - The findings and recommendations were discussed with                        the patient's family. Manya Silvas, MD 03/04/2017 7:59:48 AM This report has been signed electronically. Number of Addenda: 0 Note Initiated On: 03/04/2017 7:23 AM      [facility]

## 2017-03-04 NOTE — Anesthesia Postprocedure Evaluation (Signed)
Anesthesia Post Note  Patient: Brian Farmer  Procedure(s) Performed: Procedure(s) (LRB): ESOPHAGOGASTRODUODENOSCOPY (EGD) WITH PROPOFOL (N/A) COLONOSCOPY WITH PROPOFOL (N/A)  Patient location during evaluation: PACU Anesthesia Type: General Level of consciousness: awake and alert and oriented Pain management: pain level controlled Vital Signs Assessment: post-procedure vital signs reviewed and stable Respiratory status: spontaneous breathing Cardiovascular status: blood pressure returned to baseline Anesthetic complications: no     Last Vitals:  Vitals:   03/04/17 0704 03/04/17 0825  BP: (!) 160/75 (!) 118/56  Pulse: 73 80  Resp: 16 20  Temp: 36.5 C 36.5 C    Last Pain:  Vitals:   03/04/17 0704  TempSrc: Tympanic                 Coden Franchi

## 2017-03-05 ENCOUNTER — Encounter: Payer: Self-pay | Admitting: Unknown Physician Specialty

## 2017-03-06 LAB — SURGICAL PATHOLOGY

## 2017-03-10 ENCOUNTER — Other Ambulatory Visit: Payer: Self-pay | Admitting: Urology

## 2017-03-10 DIAGNOSIS — N400 Enlarged prostate without lower urinary tract symptoms: Secondary | ICD-10-CM

## 2017-03-16 ENCOUNTER — Ambulatory Visit: Payer: BLUE CROSS/BLUE SHIELD | Admitting: Urology

## 2017-03-16 ENCOUNTER — Encounter: Payer: Self-pay | Admitting: Urology

## 2017-03-16 VITALS — BP 117/62 | HR 86 | Ht 68.0 in | Wt 176.0 lb

## 2017-03-16 DIAGNOSIS — N400 Enlarged prostate without lower urinary tract symptoms: Secondary | ICD-10-CM | POA: Diagnosis not present

## 2017-03-16 DIAGNOSIS — G8929 Other chronic pain: Secondary | ICD-10-CM | POA: Diagnosis not present

## 2017-03-16 DIAGNOSIS — N529 Male erectile dysfunction, unspecified: Secondary | ICD-10-CM | POA: Diagnosis not present

## 2017-03-16 DIAGNOSIS — N50819 Testicular pain, unspecified: Secondary | ICD-10-CM | POA: Diagnosis not present

## 2017-03-16 MED ORDER — TAMSULOSIN HCL 0.4 MG PO CAPS: 0.4000 mg | ORAL_CAPSULE | Freq: Every day | ORAL | 4 refills | Status: DC

## 2017-03-16 MED ORDER — SILODOSIN 8 MG PO CAPS: 8.0000 mg | ORAL_CAPSULE | Freq: Every day | ORAL | 3 refills | Status: DC

## 2017-03-16 MED ORDER — TADALAFIL 5 MG PO TABS: 5.0000 mg | ORAL_TABLET | Freq: Every day | ORAL | 4 refills | Status: DC | PRN

## 2017-03-16 NOTE — Progress Notes (Signed)
03/16/2017 1:04 PM   Brian Farmer Brian Farmer 09-Jul-1956 [id number]  Referring provider: [name], MD [place], [place] [address]  Chief Complaint  Patient presents with  . Benign Prostatic Hypertrophy    HPI: 61 year old male followed here for history of BPH, erectile dysfunction, and history of chronic right testicular discomfort.   He has no urinary complaints today including no dysuria or gross hematuria. He does have a history of BPH which is well managed with Cialis 5 mg daily and Rapaflo 8 mg. He feels like he has a decent stream is able to empty his bladder. When he misses a dose, his symptoms recur.  Nocturia 1. Overall, he is pleased with his voiding symptoms on this regimen.  He denies any recent PSA screening. No previous PSA values could be found in our system.  He also has a history of erectile dysfunction. He tried other PDE 5 inhibitor such as Viagra with side effects of headache. Even with the Cialis 5 mg daily, he has difficulty maintaining his erection. He is also previously tried injections which he did not like. He does not want to pursue any further intervention.  No recent episodes of orchialgia.   PMH: Past Medical History:  Diagnosis Date  . Allergic rhinitis   . BPH (benign prostatic hyperplasia)   . Cervical disc disease   . DDD (degenerative disc disease), cervical   . Diabetes mellitus without complication ([place])   . Erectile disorder 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   . GERD (gastroesophageal reflux disease)   . Hyperlipidemia   . Hypertension   . Lynch syndrome   . Pancreatitis     Surgical History: Past Surgical History:  Procedure Laterality Date  . ANKLE SURGERY    . COLONOSCOPY WITH PROPOFOL N/A 08/02/2015   Procedure: COLONOSCOPY WITH PROPOFOL;  Surgeon: Hulen Luster, MD;  Location: [facility] ENDOSCOPY;  Service: Gastroenterology;  Laterality: N/A;  . COLONOSCOPY WITH PROPOFOL N/A 03/04/2017   Procedure: COLONOSCOPY  WITH PROPOFOL;  Surgeon: Manya Silvas, MD;  Location: [facility] ENDOSCOPY;  Service: Endoscopy;  Laterality: N/A;  . ESOPHAGOGASTRODUODENOSCOPY (EGD) WITH PROPOFOL N/A 03/04/2017   Procedure: ESOPHAGOGASTRODUODENOSCOPY (EGD) WITH PROPOFOL;  Surgeon: Manya Silvas, MD;  Location: [facility] ENDOSCOPY;  Service: Endoscopy;  Laterality: N/A;  . SHOULDER SURGERY    . TRIGGER FINGER RELEASE Bilateral     Home Medications:  Allergies as of 03/16/2017      Reactions   Naproxen Nausea Only   Vicodin [hydrocodone-acetaminophen] Nausea Only      Medication List       Accurate as of 03/16/17  1:04 PM. Always use your most recent med list.          amLODipine 5 MG tablet Commonly known as:  NORVASC Take 5 mg by mouth daily.   fluticasone 50 MCG/ACT nasal spray Commonly known as:  FLONASE Place into both nostrils daily.   insulin aspart 100 UNIT/ML FlexPen Commonly known as:  NOVOLOG Inject upto 70u per day   lisinopril 40 MG tablet Commonly known as:  PRINIVIL,ZESTRIL Take 40 mg by mouth daily.   omeprazole 20 MG capsule Commonly known as:  PRILOSEC Take 20 mg by mouth daily.   ONETOUCH DELICA LANCETS 54Y Misc by Does not apply route.   RAPAFLO 8 MG Caps capsule Generic drug:  silodosin TAKE 1 CAPSULE BY MOUTH EVERY DAY   tadalafil 5 MG tablet Commonly known as:  CIALIS Take by mouth.   TOUJEO  SOLOSTAR 300 UNIT/ML Sopn Generic drug:  Insulin Glargine Inject 50 Units into the skin 3 (three) times daily before meals.   VITAMIN D-1000 MAX ST 1000 units tablet Generic drug:  Cholecalciferol Take by mouth.       Allergies:  Allergies  Allergen Reactions  . Naproxen Nausea Only  . Vicodin [Hydrocodone-Acetaminophen] Nausea Only    Family History: Family History  Problem Relation Age of Onset  . Hypertension Mother   . Hypertension Brother   . Prostate cancer Neg Hx   . Kidney cancer Neg Hx   . Kidney disease Neg Hx     Social History:  reports that he has quit  smoking. He has never used smokeless tobacco. He reports that he drinks alcohol. He reports that he does not use drugs.  ROS: UROLOGY Frequent Urination?: No Hard to postpone urination?: No Burning/pain with urination?: No Get up at night to urinate?: Yes Leakage of urine?: No Urine stream starts and stops?: No Trouble starting stream?: No Do you have to strain to urinate?: No Blood in urine?: No Urinary tract infection?: No Sexually transmitted disease?: No Injury to kidneys or bladder?: No Painful intercourse?: No Weak stream?: No Erection problems?: Yes Penile pain?: No  Gastrointestinal Nausea?: No Vomiting?: No Indigestion/heartburn?: No Diarrhea?: No Constipation?: No  Constitutional Fever: No Night sweats?: No Weight loss?: No Fatigue?: No  Skin Skin rash/lesions?: No Itching?: No  Eyes Blurred vision?: No Double vision?: No  Ears/Nose/Throat Sore throat?: No Sinus problems?: No  Hematologic/Lymphatic Swollen glands?: No Easy bruising?: No  Cardiovascular Leg swelling?: No Chest pain?: No  Respiratory Cough?: No Shortness of breath?: No  Endocrine Excessive thirst?: No  Musculoskeletal Back pain?: No Joint pain?: No  Neurological Headaches?: No Dizziness?: No  Psychologic Depression?: No Anxiety?: No  Physical Exam: BP 117/62   Pulse 86   Ht 5\' 8"  (1.727 m)   Wt 176 lb (79.8 kg)   BMI 26.76 kg/m   Constitutional:  Alert and oriented, No acute distress. HEENT: [place] AT, moist mucus membranes.  Trachea midline, no masses. Cardiovascular: No clubbing, cyanosis, or edema. Respiratory: Normal respiratory effort, no increased work of breathing. GI: Abdomen is soft, nontender, nondistended, no abdominal masses. Rectal: Normal sphincter tone. 40+ cc prostate, nontender, no nodules. Skin: No rashes, bruises or suspicious lesions. Neurologic: Grossly intact, no focal deficits, moving all 4 extremities. Psychiatric: Normal mood and  affect.  Urinalysis n/a  Pertinent Imaging:  n/a  Assessment & Plan:    1. 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 BPH (benign prostatic hyperplasia) Continue Cialis 5 mg daily Past transition from Rapaflo to Flomax for cost reasons, both prescription were filled but advised to take only one or the other depending on which is more efficacious and cost effective  Lengthy discussion today about PSA screening. Given his age and particularly his history of Lynch syndrome, would highly recommend screening. Risk and benefits of PSA screening were discussed today including both the AUA guidelines as well as the [place] preventative task force guidelines and the controversy. All his questions were answered. At this point in time, he would like to hold off on PSA screening and will let us know if he like to proceed with a blood draw.  3. Erectile dysfunction, unspecified erectile dysfunction type Refractory erectile dysfunction despite daily Cialis.  Given his urinary symptoms are stable, he may  follow up with his PCP unless things change. He understands this and will let us know.  Hollice Espy, MD  [facility] [address] [place], [place] [address] [phone]

## 2017-04-01 ENCOUNTER — Other Ambulatory Visit: Payer: Self-pay | Admitting: Podiatry

## 2017-04-01 DIAGNOSIS — M7671 Peroneal tendinitis, right leg: Secondary | ICD-10-CM

## 2017-04-09 ENCOUNTER — Ambulatory Visit: Payer: BLUE CROSS/BLUE SHIELD

## 2018-04-13 ENCOUNTER — Ambulatory Visit: Payer: BLUE CROSS/BLUE SHIELD | Admitting: Urology

## 2018-04-13 ENCOUNTER — Encounter: Payer: Self-pay | Admitting: Urology

## 2018-04-13 VITALS — BP 117/68 | HR 97 | Ht 68.0 in | Wt 172.0 lb

## 2018-04-13 DIAGNOSIS — R3915 Urgency of urination: Secondary | ICD-10-CM | POA: Diagnosis not present

## 2018-04-13 DIAGNOSIS — N5203 Combined arterial insufficiency and corporo-venous occlusive erectile dysfunction: Secondary | ICD-10-CM

## 2018-04-13 DIAGNOSIS — N401 Enlarged prostate with lower urinary tract symptoms: Secondary | ICD-10-CM | POA: Diagnosis not present

## 2018-04-13 LAB — MICROSCOPIC EXAMINATION
EPITHELIAL CELLS (NON RENAL): NONE SEEN /HPF (ref 0–10)
RBC MICROSCOPIC, UA: NONE SEEN /HPF (ref 0–2)
WBC, UA: NONE SEEN /hpf (ref 0–5)

## 2018-04-13 LAB — URINALYSIS, COMPLETE
Bilirubin, UA: NEGATIVE
Glucose, UA: NEGATIVE
KETONES UA: NEGATIVE
LEUKOCYTES UA: NEGATIVE
Nitrite, UA: NEGATIVE
PROTEIN UA: NEGATIVE
RBC UA: NEGATIVE
Specific Gravity, UA: 1.02 (ref 1.005–1.030)
Urobilinogen, Ur: 1 mg/dL (ref 0.2–1.0)
pH, UA: 5.5 (ref 5.0–7.5)

## 2018-04-13 LAB — BLADDER SCAN AMB NON-IMAGING

## 2018-04-13 MED ORDER — SILDENAFIL CITRATE 20 MG PO TABS: 20.0000 mg | ORAL_TABLET | ORAL | 11 refills | Status: DC | PRN

## 2018-04-13 NOTE — Progress Notes (Signed)
04/13/2018 2:43 PM   Brian Farmer [birth year] [id number]  Referring provider: [name], MD [place], [place] [address]  Chief Complaint  Patient presents with  . Urinary Urgency    HPI: 62 year old male with a history of BPH and erectile dysfunction who returns today with worsening urinary symptoms.  Personal history of BPH on flomax and cialis 5 mg daily.  He was unable to renew his prescription for Cialis 6 months ago but is noticed no dramatic worsening or improvement in his symptoms since coming off of this medication.  No dysuria or gross hematuria.  Overall, he notes that over the past year, he has had increasingly difficult times with both irritative and obstructive voiding symptoms.  IPSS as below.  He now may be interested in pursuing interventions options surgery.  PVR today 19 cc.   UA today negative.     PSA 1.67 on 02/2018.  Low  He has a personaly history of chronic recurrent orchalgia and ED.    IPSS    Row Name 04/13/18 1400         International Prostate Symptom Score   How often have you had the sensation of not emptying your bladder?  Almost always     How often have you had to urinate less than every two hours?  About half the time     How often have you found you stopped and started again several times when you urinated?  More than half the time     How often have you found it difficult to postpone urination?  More than half the time     How often have you had a weak urinary stream?  More than half the time     How often have you had to strain to start urination?  Not at All     How many times did you typically get up at night to urinate?  None     Total IPSS Score  20       Quality of Life due to urinary symptoms   If you were to spend the rest of your life with your urinary condition just the way it is now how would you feel about that?  Mostly Disatisfied        Score:  1-7 Mild 8-19 Moderate 20-35  Severe  PMH: Past Medical History:  Diagnosis Date  . Allergic rhinitis   . BPH (benign prostatic hyperplasia)   . Cervical disc disease   . DDD (degenerative disc disease), cervical   . Diabetes mellitus without complication ([place])   . Erectile disorder 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   . GERD (gastroesophageal reflux disease)   . Hyperlipidemia   . Hypertension   . Lynch syndrome   . Pancreatitis     Surgical History: Past Surgical History:  Procedure Laterality Date  . ANKLE SURGERY    . COLONOSCOPY WITH PROPOFOL N/A 08/02/2015   Procedure: COLONOSCOPY WITH PROPOFOL;  Surgeon: Hulen Luster, MD;  Location: [facility] ENDOSCOPY;  Service: Gastroenterology;  Laterality: N/A;  . COLONOSCOPY WITH PROPOFOL N/A 03/04/2017   Procedure: COLONOSCOPY WITH PROPOFOL;  Surgeon: Manya Silvas, MD;  Location: [facility] ENDOSCOPY;  Service: Endoscopy;  Laterality: N/A;  . ESOPHAGOGASTRODUODENOSCOPY (EGD) WITH PROPOFOL N/A 03/04/2017   Procedure: ESOPHAGOGASTRODUODENOSCOPY (EGD) WITH PROPOFOL;  Surgeon: Manya Silvas, MD;  Location: [facility] ENDOSCOPY;  Service: Endoscopy;  Laterality: N/A;  . SHOULDER SURGERY    . TRIGGER FINGER RELEASE  Bilateral     Home Medications:  Allergies as of 04/13/2018      Reactions   Naproxen Nausea Only   Vicodin [hydrocodone-acetaminophen] Nausea Only      Medication List        Accurate as of 04/13/18  2:43 PM. Always use your most recent med list.          amLODipine 5 MG tablet Commonly known as:  NORVASC Take 5 mg by mouth daily.   fluticasone 50 MCG/ACT nasal spray Commonly known as:  FLONASE Place into both nostrils daily.   insulin aspart 100 UNIT/ML FlexPen Commonly known as:  NOVOLOG Inject upto 70u per day   lisinopril 40 MG tablet Commonly known as:  PRINIVIL,ZESTRIL Take 40 mg by mouth daily.   omeprazole 20 MG capsule Commonly known as:  PRILOSEC Take 20 mg by mouth daily.   ONETOUCH DELICA LANCETS 88C Misc by Does not apply  route.   rosuvastatin 40 MG tablet Commonly known as:  CRESTOR   sildenafil 20 MG tablet Commonly known as:  REVATIO Take 1 tablet (20 mg total) by mouth as needed. Take 1-5 tabs as needed prior to intercourse   tamsulosin 0.4 MG Caps capsule Commonly known as:  FLOMAX Take 1 capsule (0.4 mg total) by mouth daily.   TOUJEO SOLOSTAR 300 UNIT/ML Sopn Generic drug:  Insulin Glargine Inject 50 Units into the skin 3 (three) times daily before meals.   VITAMIN D-1000 MAX ST 1000 units tablet Generic drug:  Cholecalciferol Take by mouth.       Allergies:  Allergies  Allergen Reactions  . Naproxen Nausea Only  . Vicodin [Hydrocodone-Acetaminophen] Nausea Only    Family History: Family History  Problem Relation Age of Onset  . Hypertension Mother   . Hypertension Brother   . Prostate cancer Neg Hx   . Kidney cancer Neg Hx   . Kidney disease Neg Hx     Social History:  reports that he has quit smoking. He has never used smokeless tobacco. He reports that he drinks alcohol. He reports that he does not use drugs.  ROS: UROLOGY Frequent Urination?: No Hard to postpone urination?: No Burning/pain with urination?: No Get up at night to urinate?: No Leakage of urine?: No Urine stream starts and stops?: No Trouble starting stream?: No Do you have to strain to urinate?: No Blood in urine?: No Urinary tract infection?: No Sexually transmitted disease?: No Injury to kidneys or bladder?: No Painful intercourse?: No Weak stream?: Yes Erection problems?: No Penile pain?: No  Gastrointestinal Nausea?: No Vomiting?: No Indigestion/heartburn?: No Diarrhea?: No Constipation?: No  Constitutional Fever: No Night sweats?: No Weight loss?: No Fatigue?: No  Skin Skin rash/lesions?: No Itching?: No  Eyes Blurred vision?: No Double vision?: No  Ears/Nose/Throat Sore throat?: No Sinus problems?: No  Hematologic/Lymphatic Swollen glands?: No Easy bruising?:  No  Cardiovascular Leg swelling?: No Chest pain?: No  Respiratory Cough?: No Shortness of breath?: No  Endocrine Excessive thirst?: No  Musculoskeletal Back pain?: No Joint pain?: No  Neurological Headaches?: No Dizziness?: No  Psychologic Depression?: No Anxiety?: No  Physical Exam: BP 117/68   Pulse 97   Ht 5\' 8"  (1.727 m)   Wt 172 lb (78 kg)   BMI 26.15 kg/m   Constitutional:  Alert and oriented, No acute distress. HEENT: [place] AT, moist mucus membranes.  Trachea midline, no masses. Cardiovascular: No clubbing, cyanosis, or edema. Respiratory: Normal respiratory effort, no increased work of breathing. Skin: No rashes,  bruises or suspicious lesions. Neurologic: Grossly intact, no focal deficits, moving all 4 extremities. Psychiatric: Normal mood and affect.  Laboratory Data: PSA as above  Urinalysis Results for orders placed or performed in visit on 04/13/18  Microscopic Examination  Result Value Ref Range   WBC, UA None seen 0 - 5 /hpf   RBC, UA None seen 0 - 2 /hpf   Epithelial Cells (non renal) None seen 0 - 10 /hpf   Mucus, UA Present (A) Not Estab.   Bacteria, UA Few (A) None seen/Few  Urinalysis, Complete  Result Value Ref Range   Specific Gravity, UA 1.020 1.005 - 1.030   pH, UA 5.5 5.0 - 7.5   Color, UA Yellow Yellow   Appearance Ur Clear Clear   Leukocytes, UA Negative Negative   Protein, UA Negative Negative/Trace   Glucose, UA Negative Negative   Ketones, UA Negative Negative   RBC, UA Negative Negative   Bilirubin, UA Negative Negative   Urobilinogen, Ur 1.0 0.2 - 1.0 mg/dL   Nitrite, UA Negative Negative   Microscopic Examination See below:   BLADDER SCAN AMB NON-IMAGING  Result Value Ref Range   Scan Result 9ml     Assessment & Plan:    1. Benign prostatic hyperplasia (BPH) with urinary urgency And refractory symptoms despite Flomax/Cialis We discussed at this point, he may consider surgical intervention on no urinary urgency  frequency and irritative voiding symptoms may be related to bladder overactivity rather than outlet obstruction He is agreeable for cystoscopy/prostate sizing for surgical planning Rectal exam at time of prostate sizing  - Urinalysis, Complete - BLADDER SCAN AMB NON-IMAGING  2. Combined arterial insufficiency and corporo-venous occlusive erectile dysfunction Unable to fill Cialis secondary to insurance coverage Discussed second trial of generic sildenafil, prescription sent to [facility]  Return for cysto/ prostate sizing (TRUS), .  Hollice Espy, MD  [facility] [address] [place], [place] [address] [phone]

## 2018-05-27 ENCOUNTER — Encounter: Payer: Self-pay | Admitting: Gastroenterology

## 2018-06-01 ENCOUNTER — Other Ambulatory Visit: Payer: BLUE CROSS/BLUE SHIELD | Admitting: Urology

## 2018-06-01 ENCOUNTER — Other Ambulatory Visit: Payer: Self-pay | Admitting: Urology

## 2018-06-30 ENCOUNTER — Other Ambulatory Visit: Payer: Self-pay

## 2018-06-30 ENCOUNTER — Encounter: Payer: Self-pay | Admitting: Gastroenterology

## 2018-06-30 ENCOUNTER — Ambulatory Visit: Payer: BLUE CROSS/BLUE SHIELD | Admitting: Gastroenterology

## 2018-06-30 VITALS — BP 111/66 | HR 78 | Ht 68.0 in | Wt 174.0 lb

## 2018-06-30 DIAGNOSIS — R197 Diarrhea, unspecified: Secondary | ICD-10-CM | POA: Diagnosis not present

## 2018-06-30 DIAGNOSIS — Z800 Family history of malignant neoplasm of digestive organs: Secondary | ICD-10-CM

## 2018-06-30 DIAGNOSIS — Z148 Genetic carrier of other disease: Secondary | ICD-10-CM

## 2018-06-30 DIAGNOSIS — R109 Unspecified abdominal pain: Secondary | ICD-10-CM | POA: Diagnosis not present

## 2018-06-30 MED ORDER — RANITIDINE HCL 150 MG PO TABS: 150.0000 mg | ORAL_TABLET | Freq: Two times a day (BID) | ORAL | 2 refills | Status: DC

## 2018-06-30 MED ORDER — DICYCLOMINE HCL 10 MG PO CAPS: 10.0000 mg | ORAL_CAPSULE | Freq: Three times a day (TID) | ORAL | 0 refills | Status: DC

## 2018-06-30 NOTE — Progress Notes (Signed)
Brian Bellows MD, MRCP(U.K) [address]  [place]  [place], [place] [address]  Main: [phone]  Fax: [phone]   Gastroenterology Consultation  Referring Provider:     [name], MD Primary Care Physician:  [name], MD Primary Gastroenterologist:  Dr. Jonathon Farmer  Reason for Consultation:     Abdominal pain         HPI:   Brian Farmer is a 62 y.o. y/o male referred for consultation & management  by Dr. Ellison Hughs, Chrissie Noa, MD.   He has been referred for abdominal pain.  He has a history of type 2 diabetes, GERD.  Last colonoscopy was in May 2018 by Dr. Vira Agar rectal polyp was excised.  LA grade a esophagitis was seen on his upper endoscopy.  Labs in July 2019 demonstrates a hemoglobin of 13 g MCV of 94 H. pylori stool antigen negative.  CMP is normal except for an elevated glucose at 135.   He carries a diagnosis of Muir-Torre syndrome.   Abdominal pain: Onset: Began 3.5 months back, seems to get worse. On and off , occurs 3-4 times a week , lasts 60-90 mins Site :lower abdomen and goes upper  Radiation:  Severity : can be yupto 8/10 , usually around 5/10  Nature of pain: hard dull pain  Aggravating factors: unsure  Relieving factors :bowel movement a times always Weight loss: 5 lbs  NSAID use: advil- 3 pills a day for years  PPI use :had prilosec stopped and was commenced on something else  Gall bladder surgery: intact  Frequency of bowel movements: diarrhea - every day - ongoing for 3.5 months, if he passes gas has a bowel movement , watery , no blood  Change in bowel movements: yes  Relief with bowel movements: yes  Gas/Bloating/Abdominal distension: gas , some bloating as well .   Consumes artificial sugars- 10 sachets of "blue packet" cant recall name, diet soda 1 can a week , no hard candy . No other sweet food.   He has had colon polyps the last visit.   He says the diagnosis with lynch sydrome is not clear.     Past Medical  History:  Diagnosis Date  . Allergic rhinitis   . BPH (benign prostatic hyperplasia)   . Cervical disc disease   . DDD (degenerative disc disease), cervical   . Diabetes mellitus without complication ([place])   . Erectile disorder 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   . GERD (gastroesophageal reflux disease)   . Hyperlipidemia   . Hypertension   . Lynch syndrome   . Pancreatitis     Past Surgical History:  Procedure Laterality Date  . ANKLE SURGERY    . COLONOSCOPY WITH PROPOFOL N/A 08/02/2015   Procedure: COLONOSCOPY WITH PROPOFOL;  Surgeon: Hulen Luster, MD;  Location: [facility] ENDOSCOPY;  Service: Gastroenterology;  Laterality: N/A;  . COLONOSCOPY WITH PROPOFOL N/A 03/04/2017   Procedure: COLONOSCOPY WITH PROPOFOL;  Surgeon: Manya Silvas, MD;  Location: [facility] ENDOSCOPY;  Service: Endoscopy;  Laterality: N/A;  . ESOPHAGOGASTRODUODENOSCOPY (EGD) WITH PROPOFOL N/A 03/04/2017   Procedure: ESOPHAGOGASTRODUODENOSCOPY (EGD) WITH PROPOFOL;  Surgeon: Manya Silvas, MD;  Location: [facility] ENDOSCOPY;  Service: Endoscopy;  Laterality: N/A;  . SHOULDER SURGERY    . TRIGGER FINGER RELEASE Bilateral     Prior to Admission medications   Medication Sig Start Date End Date Taking? Authorizing Provider  amLODipine (NORVASC) 5 MG tablet Take 5 mg by mouth daily.  [provider]  Cholecalciferol (VITAMIN D-1000 MAX ST) 1000 units tablet Take by mouth.    [provider]  fluticasone (FLONASE) 50 MCG/ACT nasal spray Place into both nostrils daily.    [provider]  insulin aspart (NOVOLOG) 100 UNIT/ML FlexPen Inject upto 70u per day 05/29/16   [provider]  Insulin Glargine (TOUJEO SOLOSTAR) 300 UNIT/ML SOPN Inject 50 Units into the skin 3 (three) times daily before meals.    [provider]  lisinopril (PRINIVIL,ZESTRIL) 40 MG tablet Take 40 mg by mouth daily.    [provider]  omeprazole (PRILOSEC) 20 MG capsule Take 20 mg by mouth daily.     [provider]  [facility] DELICA LANCETS 65K MISC by Does not apply route.    [provider]  rosuvastatin (CRESTOR) 40 MG tablet  03/06/18   [provider]  sildenafil (REVATIO) 20 MG tablet Take 1 tablet (20 mg total) by mouth as needed. Take 1-5 tabs as needed prior to intercourse 04/13/18   Hollice Espy, MD  tamsulosin (FLOMAX) 0.4 MG CAPS capsule TAKE 1 CAPSULE BY MOUTH EVERY DAY 06/01/18   Ernestine Conrad Hunt Oris, PA-C    Family History  Problem Relation Age of Onset  . Hypertension Mother   . Hypertension Brother   . Prostate cancer Neg Hx   . Kidney cancer Neg Hx   . Kidney disease Neg Hx      Social History   Tobacco Use  . Smoking status: Former Research scientist (life sciences)  . Smokeless tobacco: Never Used  Substance Use Topics  . Alcohol use: Yes    Comment: occasional  . Drug use: No    Allergies as of 06/30/2018 - Review Complete 04/13/2018  Allergen Reaction Noted  . Naproxen Nausea Only 07/20/2016  . Vicodin [hydrocodone-acetaminophen] Nausea Only 08/01/2015    Review of Systems:    All systems reviewed and negative except where noted in HPI.   Physical Exam:  There were no vitals taken for this visit. No LMP for male patient. Psych:  Alert and cooperative. Normal mood and affect. General:   Alert,  Well-developed, well-nourished, pleasant and cooperative in NAD Head:  Normocephalic and atraumatic. Eyes:  Sclera clear, no icterus.   Conjunctiva pink. Ears:  Normal auditory acuity. Nose:  No deformity, discharge, or lesions. Mouth:  No deformity or lesions,oropharynx pink & moist. Neck:  Supple; no masses or thyromegaly. Lungs:  Respirations even and unlabored.  Clear throughout to auscultation.   No wheezes, crackles, or rhonchi. No acute distress. Heart:  Regular rate and rhythm; no murmurs, clicks, rubs, or gallops. Abdomen:  Normal bowel sounds.  No bruits.  Soft, non-tender and non-distended without masses, hepatosplenomegaly or hernias noted.  No  guarding or rebound tenderness.    Neurologic:  Alert and oriented x3;  grossly normal neurologically. Skin:  Intact without significant lesions or rashes. No jaundice. Lymph Nodes:  No significant cervical adenopathy. Psych:  Alert and cooperative. Normal mood and affect.  Imaging Studies: No results found.  Assessment and Plan:   Garik Diamant is a 62 y.o. y/o male has been referred for  abdominal pain and also has diarrhea. Long term use of NSAID's as well as large qty of artificial sugars. No red flag signs. Unclear prior diagnosis of lynch syndrome .    Plan 1.  Stop all artificial sugars and NSAID's both of which can cause diarrhea and abdominal cramping. If no better in 6 weeks then will consider CT scan of  the abdomen . 2.  PPI 3. Genetic testing for lynch syndrome - refer to oncology for  counseling as he has some mention of lynch syndrome but his history does not clearly support it.  4. Bentyl PRN.   Follow up in 4-6 WEEKS  Dr Brian Bellows MD,MRCP(U.K)

## 2018-07-06 ENCOUNTER — Telehealth: Payer: Self-pay | Admitting: Gastroenterology

## 2018-07-06 NOTE — Telephone Encounter (Signed)
Patient states Dr.Anna advised against taking Advil and ibuprofen but did not say what the pt could take. Patient wanting to know what he can take for headaches.

## 2018-07-07 LAB — GI PROFILE, STOOL, PCR
ADENOVIRUS F 40/41: NOT DETECTED
Astrovirus: NOT DETECTED
C difficile toxin A/B: NOT DETECTED
CRYPTOSPORIDIUM: NOT DETECTED
CYCLOSPORA CAYETANENSIS: NOT DETECTED
Campylobacter: NOT DETECTED
ENTEROTOXIGENIC E COLI: NOT DETECTED
Entamoeba histolytica: NOT DETECTED
Enteroaggregative E coli: NOT DETECTED
Enteropathogenic E coli: NOT DETECTED
Giardia lamblia: NOT DETECTED
NOROVIRUS GI/GII: NOT DETECTED
PLESIOMONAS SHIGELLOIDES: NOT DETECTED
ROTAVIRUS A: NOT DETECTED
SALMONELLA: NOT DETECTED
SHIGELLA/ENTEROINVASIVE E COLI: NOT DETECTED
Sapovirus: NOT DETECTED
Shiga-toxin-producing E coli: NOT DETECTED
VIBRIO: NOT DETECTED
Vibrio cholerae: NOT DETECTED
Yersinia enterocolitica: NOT DETECTED

## 2018-07-07 LAB — CLOSTRIDIUM DIFFICILE BY PCR: Toxigenic C. Difficile by PCR: NEGATIVE

## 2018-07-07 LAB — CALPROTECTIN, FECAL: Calprotectin, Fecal: 25 ug/g (ref 0–120)

## 2018-07-08 ENCOUNTER — Encounter: Payer: Self-pay | Admitting: Gastroenterology

## 2018-07-09 NOTE — Telephone Encounter (Signed)
Tylenol is ok

## 2018-07-14 NOTE — Telephone Encounter (Signed)
Left vm with Dr. Georgeann Oppenheim recommendation.

## 2018-07-29 ENCOUNTER — Encounter: Payer: Self-pay | Admitting: Gastroenterology

## 2018-07-29 ENCOUNTER — Ambulatory Visit (INDEPENDENT_AMBULATORY_CARE_PROVIDER_SITE_OTHER): Payer: BLUE CROSS/BLUE SHIELD | Admitting: Gastroenterology

## 2018-07-29 VITALS — BP 107/73 | HR 81 | Ht 68.0 in | Wt 171.4 lb

## 2018-07-29 DIAGNOSIS — K580 Irritable bowel syndrome with diarrhea: Secondary | ICD-10-CM

## 2018-07-29 MED ORDER — RIFAXIMIN 550 MG PO TABS: 550.0000 mg | ORAL_TABLET | Freq: Three times a day (TID) | ORAL | 0 refills | Status: AC

## 2018-07-29 MED ORDER — DICYCLOMINE HCL 10 MG PO CAPS: 10.0000 mg | ORAL_CAPSULE | Freq: Three times a day (TID) | ORAL | 0 refills | Status: DC

## 2018-07-29 NOTE — Progress Notes (Signed)
Jonathon Bellows MD, MRCP(U.K) [address]  [place]  [place], [place] [address]  Main: [phone]  Fax: [phone]   Primary Care Physician: [name], MD  Primary Gastroenterologist:  Dr. Jonathon Bellows   No chief complaint on file.   HPI: Moody Robben is a 62 y.o. male  Summary of history : He is being followed for abdominal pain and diarrhea.    He was initially referred and seen on 06/30/2018 by Dr. Ellison Hughs for abdominal pain.  He has a history of type 2 diabetes, GERD.  Last colonoscopy in May 2018 by Dr. Vira Agar had a rectal polyp that was excised.  He did have LA grade a esophagitis seen on his upper endoscopy.  Hemoglobin of 13 g in July 2019 with an MCV of 94.  H. pylori stool antigen negative.  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  Located in the upper and lower abdomen.  Moderate to severe in intensity, dull in nature.  Unsure of any aggravating factors.  Always relieved a bowel movement.  He has lost 5 pounds of weight.  He was on and states taking Advil 3 pills a day for many years.  Had been on Prilosec but was stopped but was commenced on another drug which he could not recall.  His gallbladder is intact.  He has been having diarrhea every day for 3-1/2 months to 4 months.  Had a bowel movement when he passed gas.  He did feel better after bowel movement.  Also had bloating.  He consumed 10 sessions of artificial sugar each day.  Can of diet soda a week.  Concern about Lynch syndrome.  Diagnosis was not formally made.  I felt that his diarrhea was secondary to artificial sugars and possibly an effect of chronic NSAID use.  We decided to stop his NSAIDs and artificial sugars and reevaluate him in a couple of weeks and if no better would consider further evaluation.  I did refer him for genetic  testing for Lynch syndrome.  Interval history   06/30/2018 to 07/29/2018  07/02/18 : GI stool PCR, C diff PCR,fecal calprotectin- negative  Says while taking Bentyl the diarrhea stopped and came back after he stopped the bentyl. Today no diarrhea as he took OTC meds for diarrhea, prior to which was 2-3 episodes over 2-3 days period. Stopped all artificial sugars. Think it made a difference.   He has been referred to oncology for genetic testing.   He says that last Thursday was at school, was a bit upset , had an episode of diarrhea. Yesterday while driving had further diarrhea. No abdominal pain . No NSAID's     Current Outpatient Medications  Medication Sig Dispense Refill  . amLODipine (NORVASC) 5 MG tablet Take 5 mg by mouth daily.    . Cholecalciferol (VITAMIN D-1000 MAX ST) 1000 units tablet Take by mouth.    . dicyclomine (BENTYL) 10 MG capsule Take 1 capsule (10 mg total) by mouth 4 (four) times daily -  before meals and at bedtime. 90 capsule 0  . fluticasone (FLONASE) 50 MCG/ACT nasal spray Place into both nostrils daily.    . insulin aspart (NOVOLOG) 100 UNIT/ML FlexPen Inject upto 70u per day    . Insulin Glargine (TOUJEO SOLOSTAR) 300 UNIT/ML SOPN Inject 50 Units into  the skin 3 (three) times daily before meals.    Marland Kitchen lisinopril (PRINIVIL,ZESTRIL) 40 MG tablet Take 40 mg by mouth daily.    Marland Kitchen omeprazole (PRILOSEC) 20 MG capsule Take 20 mg by mouth daily.    [name] DELICA LANCETS 79T MISC by Does not apply route.    . ranitidine (ZANTAC) 150 MG tablet Take 1 tablet (150 mg total) by mouth 2 (two) times daily. 60 tablet 2  . rosuvastatin (CRESTOR) 40 MG tablet   3  . sildenafil (REVATIO) 20 MG tablet Take 1 tablet (20 mg total) by mouth as needed. Take 1-5 tabs as needed prior to intercourse 30 tablet 11  . tamsulosin (FLOMAX) 0.4 MG CAPS capsule TAKE 1 CAPSULE BY MOUTH EVERY DAY 90 capsule 0   No current facility-administered medications for this visit.     Allergies as  of 07/29/2018 - Review Complete 06/30/2018  Allergen Reaction Noted  . Naproxen Nausea Only 07/20/2016  . Vicodin [hydrocodone-acetaminophen] Nausea Only 08/01/2015    ROS:  General: Negative for anorexia, weight loss, fever, chills, fatigue, weakness. ENT: Negative for hoarseness, difficulty swallowing , nasal congestion. CV: Negative for chest pain, angina, palpitations, dyspnea on exertion, peripheral edema.  Respiratory: Negative for dyspnea at rest, dyspnea on exertion, cough, sputum, wheezing.  GI: See history of present illness. GU:  Negative for dysuria, hematuria, urinary incontinence, urinary frequency, nocturnal urination.  Endo: Negative for unusual weight change.    Physical Examination:   BP 107/73   Pulse 81   Ht 5\' 8"  (1.727 m)   Wt 171 lb 6.4 oz (77.7 kg)   BMI 26.06 kg/m   General: Well-nourished, well-developed in no acute distress.  Eyes: No icterus. Conjunctivae pink. Mouth: Oropharyngeal mucosa moist and pink , no lesions erythema or exudate. Lungs: Clear to auscultation bilaterally. Non-labored. Heart: Regular rate and rhythm, no murmurs rubs or gallops.  Abdomen: Bowel sounds are normal, nontender, nondistended, no hepatosplenomegaly or masses, no abdominal bruits or hernia , no rebound or guarding.   Extremities: No lower extremity edema. No clubbing or deformities. Neuro: Alert and oriented x 3.  Grossly intact. Skin: Warm and dry, no jaundice.   Psych: Alert and cooperative, normal mood and affect.   Imaging Studies: No results found.  Assessment and Plan:   Kairav Russomanno is a 62 y.o. y/o male *here to follow up for  abdominal pain and also has diarrhea. Long term use of NSAID's as well as large qty of artificial sugars. No red flag signs. Unclear prior diagnosis of lynch syndrome . Likely IBS-D  Plan 1.  Stop all artificial sugars, trial of Xifaxan. Abdominal pain has stopped . 2.  PPI 3. Bentyl PRN.   Dr Jonathon Bellows  MD,MRCP  [facility]) Follow up in 6 weeks

## 2018-08-15 ENCOUNTER — Other Ambulatory Visit: Payer: Self-pay | Admitting: Gastroenterology

## 2018-08-19 ENCOUNTER — Other Ambulatory Visit: Payer: BLUE CROSS/BLUE SHIELD

## 2018-08-19 ENCOUNTER — Encounter: Payer: BLUE CROSS/BLUE SHIELD | Admitting: Genetics

## 2018-08-23 ENCOUNTER — Telehealth: Payer: Self-pay

## 2018-08-23 NOTE — Telephone Encounter (Signed)
Spoke with regarding Xifaxan 550mg  prior auth. I informed him we are waiting for approval from his insurance. I explained that Dr. Vicente Males suggests that he pick up Xifaxan 550mg  samples at our office. Pt plans to pick up samples today.

## 2018-09-09 ENCOUNTER — Encounter: Payer: Self-pay | Admitting: Gastroenterology

## 2018-09-09 ENCOUNTER — Ambulatory Visit: Payer: BLUE CROSS/BLUE SHIELD | Admitting: Gastroenterology

## 2018-09-09 VITALS — BP 145/75 | HR 84 | Ht 68.0 in | Wt 174.0 lb

## 2018-09-09 DIAGNOSIS — K580 Irritable bowel syndrome with diarrhea: Secondary | ICD-10-CM | POA: Diagnosis not present

## 2018-09-09 NOTE — Progress Notes (Signed)
Brian Bellows MD, MRCP(U.K) [address]  [place]  [place], [place] [address]  Main: [phone]  Fax: [phone]   Primary Care Physician: [name], MD  Primary Gastroenterologist:  Dr. Jonathon Farmer   No chief complaint on file.   HPI: Brian Farmer is a 62 y.o. male   Summary of history : He is being followed for abdominal pain and diarrhea.    He was initially referred and seen on 06/30/2018 by Dr. Ellison Farmer for abdominal pain.  He has a history of type 2 diabetes, GERD.  Last colonoscopy in May 2018 by Dr. Vira Farmer had a rectal polyp that was excised.  He did have LA grade a esophagitis seen on his upper endoscopy.   H. pylori stool antigen negative.  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    At his initial visit he was on long-term NSAIDs, consumed a lot of artificial sugars which I felt contributed to his diarrhea bloating and abdominal pain which she subsequently stopped and had improvement of his symptoms.  I also felt that his abdominal pain was secondary to NSAID use which he also had resolved at his last appointment.   07/02/18 : GI stool PCR, C diff PCR,fecal calprotectin- negative  Interval history   07/29/2018 to 09/09/2018 Says while he took Xifaxan had no diarrhea and when he stopped the antibiotics had diarrhea. Took 14 days of xifaxan. Had to take 2 inmodiums - since has not had any diarrhea.    He has been referred to oncology for genetic testin this month. Minimal abdominal pain- significantly better.     Current Outpatient Medications  Medication Sig Dispense Refill  . amLODipine (NORVASC) 5 MG tablet Take 5 mg by mouth daily.    . Cholecalciferol (VITAMIN D-1000 MAX ST) 1000 units tablet Take by mouth.    . cyanocobalamin (,VITAMIN B-12,) 1000 MCG/ML injection   1  . dicyclomine (BENTYL) 10 MG capsule Take 1 capsule (10 mg total) by mouth 4 (four) times daily -  before meals and at  bedtime. 90 capsule 0  . fluticasone (FLONASE) 50 MCG/ACT nasal spray Place into both nostrils daily.    . insulin aspart (NOVOLOG) 100 UNIT/ML FlexPen Inject upto 70u per day    . Insulin Glargine (TOUJEO SOLOSTAR) 300 UNIT/ML SOPN Inject 50 Units into the skin 3 (three) times daily before meals.    Marland Kitchen lisinopril (PRINIVIL,ZESTRIL) 40 MG tablet Take 40 mg by mouth daily.    Marland Kitchen omeprazole (PRILOSEC) 20 MG capsule Take 20 mg by mouth daily.    [name] DELICA LANCETS 13Y MISC by Does not apply route.    . ranitidine (ZANTAC) 150 MG tablet Take 1 tablet (150 mg total) by mouth 2 (two) times daily. 60 tablet 2  . rosuvastatin (CRESTOR) 40 MG tablet   3  . sildenafil (REVATIO) 20 MG tablet Take 1 tablet (20 mg total) by mouth as needed. Take 1-5 tabs as needed prior to intercourse 30 tablet 11  . tamsulosin (FLOMAX) 0.4 MG CAPS capsule TAKE 1 CAPSULE BY MOUTH EVERY DAY 90 capsule 0   No current facility-administered medications for this visit.     Allergies as of 09/09/2018 - Review Complete 07/29/2018  Allergen Reaction Noted  . Naproxen Nausea Only 07/20/2016  . Vicodin [hydrocodone-acetaminophen] Nausea Only 08/01/2015    ROS:  General: Negative for anorexia, weight loss, fever, chills, fatigue, weakness. ENT: Negative for hoarseness, difficulty swallowing ,  nasal congestion. CV: Negative for chest pain, angina, palpitations, dyspnea on exertion, peripheral edema.  Respiratory: Negative for dyspnea at rest, dyspnea on exertion, cough, sputum, wheezing.  GI: See history of present illness. GU:  Negative for dysuria, hematuria, urinary incontinence, urinary frequency, nocturnal urination.  Endo: Negative for unusual weight change.    Physical Examination:   There were no vitals taken for this visit.  General: Well-nourished, well-developed in no acute distress.  Eyes: No icterus. Conjunctivae pink. Mouth: Oropharyngeal mucosa moist and pink , no lesions erythema or exudate. Lungs:  Clear to auscultation bilaterally. Non-labored. Heart: Regular rate and rhythm, no murmurs rubs or gallops.  Abdomen: Bowel sounds are normal, nontender, nondistended, no hepatosplenomegaly or masses, no abdominal bruits or hernia , no rebound or guarding.   Extremities: No lower extremity edema. No clubbing or deformities. Neuro: Alert and oriented x 3.  Grossly intact. Skin: Warm and dry, no jaundice.   Psych: Alert and cooperative, normal mood and affect.   Imaging Studies: No results found.  Assessment and Plan:   Brian Farmer is a 62 y.o. y/o male here to follow up for abdominal pain and also has diarrhea. Long term use of NSAID's as well as large qty of artificial sugars. No red flag signs. 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   Plan 1.Stop all artificial sugars, LOW FODMAP diet  2. If symptoms return try Xifaxan for another 14 days.   Dr Brian Bellows  MD,MRCP [facility]) Follow up in PRN

## 2018-09-20 ENCOUNTER — Encounter: Payer: Self-pay | Admitting: Genetics

## 2018-09-23 ENCOUNTER — Inpatient Hospital Stay: Payer: BLUE CROSS/BLUE SHIELD

## 2018-09-23 ENCOUNTER — Encounter: Payer: Self-pay | Admitting: Genetics

## 2018-09-23 ENCOUNTER — Inpatient Hospital Stay: Payer: BLUE CROSS/BLUE SHIELD | Attending: Oncology | Admitting: Genetics

## 2018-09-23 DIAGNOSIS — Z800 Family history of malignant neoplasm of digestive organs: Secondary | ICD-10-CM

## 2018-09-23 DIAGNOSIS — Z806 Family history of leukemia: Secondary | ICD-10-CM

## 2018-09-23 DIAGNOSIS — Z7183 Encounter for nonprocreative genetic counseling: Secondary | ICD-10-CM

## 2018-09-23 NOTE — Progress Notes (Addendum)
REFERRING PROVIDER: Jonathon Bellows, MD [place], [place] [address]  PRIMARY PROVIDER:  [name], MD  PRIMARY REASON FOR VISIT:  1. Family history of leukemia   2. Family history of colon cancer     HISTORY OF PRESENT ILLNESS:   Brian Farmer, a 62 y.o. male, was seen for a [facility] cancer genetics consultation at the request of Dr. Vicente Farmer due to a personal history of skin findings and  family history of cancer.  Brian Farmer presents to clinic today to discuss the possibility of a hereditary predisposition to cancer, genetic testing, and to further clarify his future cancer risks, as well as potential cancer risks for family members.   Brian Farmer reports that in 2016 he went to a dermatologist who removed a few skin lesions from his face.  She called him and told him he 'might have cancer' and that they were suspicious of [place].  He had no follow-up, but Brian Farmer and Brian Farmer have been documented in his chart several times now.  He had a colonocosy in 2016 and another in 2018 (with upper endoscopy).  His 2018 colonoscopy revealed 1 rectal poly.  Brian Farmer saw a new dermatologist 8 months ago and reports he has shared this question of Brian Farmer with him.   CANCER HISTORY:   No history exists.    Past Medical History:  Diagnosis Date  . Allergic rhinitis   . BPH (benign prostatic hyperplasia)   . Cervical disc disease   . DDD (degenerative disc disease), cervical   . Diabetes mellitus without complication ([place])   . Erectile disorder 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   . Family history of colon cancer   . Family history of leukemia   . Family history of leukemia   . GERD (gastroesophageal reflux disease)   . Hyperlipidemia   . Hypertension   . Brian Farmer   . Pancreatitis     Past Surgical History:  Procedure Laterality Date  . ANKLE SURGERY    . COLONOSCOPY WITH PROPOFOL N/A 08/02/2015   Procedure: COLONOSCOPY WITH  PROPOFOL;  Surgeon: Brian Luster, MD;  Location: [facility] ENDOSCOPY;  Service: Gastroenterology;  Laterality: N/A;  . COLONOSCOPY WITH PROPOFOL N/A 03/04/2017   Procedure: COLONOSCOPY WITH PROPOFOL;  Surgeon: Brian Silvas, MD;  Location: [facility] ENDOSCOPY;  Service: Endoscopy;  Laterality: N/A;  . ESOPHAGOGASTRODUODENOSCOPY (EGD) WITH PROPOFOL N/A 03/04/2017   Procedure: ESOPHAGOGASTRODUODENOSCOPY (EGD) WITH PROPOFOL;  Surgeon: Brian Silvas, MD;  Location: [facility] ENDOSCOPY;  Service: Endoscopy;  Laterality: N/A;  . SHOULDER SURGERY    . TRIGGER FINGER RELEASE Bilateral     Social History   Socioeconomic History  . Marital status: Married    Spouse name: Not on file  . Number of children: Not on file  . Years of education: Not on file  . Highest education level: Not on file  Occupational History  . Not on file  Social Needs  . Financial resource strain: Not on file  . Food insecurity:    Worry: Not on file    Inability: Not on file  . Transportation needs:    Medical: Not on file    Non-medical: Not on file  Tobacco Use  . Smoking status: Former Research scientist (life sciences)  . Smokeless tobacco: Never Used  Substance and Sexual Activity  . Alcohol use: Yes    Comment: occasional  . Drug use: No  . Sexual activity: Not on file  Lifestyle  . Physical activity:    Days per week: Not on file    Minutes per session: Not on file  . Stress: Not on file  Relationships  . Social connections:    Talks on phone: Not on file    Gets together: Not on file    Attends religious service: Not on file    Active member of club or organization: Not on file    Attends meetings of clubs or organizations: Not on file    Relationship status: Not on file  Other Topics Concern  . Not on file  Social History Narrative  . Not on file     FAMILY HISTORY:  We obtained a detailed, 4-generation family history.  Significant diagnoses are listed below: Family History  Problem Relation Age of Onset  . Hypertension Mother    . Cancer Mother 80       bone marrow  . Hypertension Brother   . Leukemia Father 47  . Leukemia Brother 30  . Colon cancer Other 71  . Prostate cancer Neg Hx   . Kidney cancer Neg Hx   . Kidney disease Neg Hx     Brian Farmer has 2 daughters and 3 sons (ages range from 57-41) with no hx of cancer.  None of them have had colonoscopies.  He has 3 grandchildren with no hx of cancer. Brian Farmer has 4 brothers and 2 sisters in their 43's/70's. 1 brother had leukemia dx at 33.  One brother's son (patient's nephew) was dx with colon cancer at 82.  He is not aware of any genetic testing for him.   Brian Farmer's father: died at 86 due to leukemia.  Paternal Aunts/Uncles: 4 paternal uncles with no hx of cancer.  All lived to be over 64.   Paternal cousins: no known cancer hx Paternal grandfather: unk Paternal grandmother:no hx of cancer  Brian Farmer mother: died at 49 due to bone marrow cancer.  She was an only child.  Maternal grandfather: unk Maternal grandmother:unk  Brian Farmer is unaware of previous family history of genetic testing for hereditary cancer risks. Patient's maternal ancestors are of [place] descent, and paternal ancestors are of [place] descent. There is no reported Ashkenazi Jewish ancestry. There is no known consanguinity.  GENETIC COUNSELING ASSESSMENT: Brian Farmer is a 62 y.o. male with a personal and family hsitory which is somewhat suggestive of a Hereditary Cancer Predisposition Farmer. We, therefore, discussed and recommended the following at today's visit.   DISCUSSION: We reviewed the characteristics, features and inheritance patterns of hereditary cancer syndromes. We also discussed genetic testing, including the appropriate family members to test, the process of testing, insurance coverage and turn-around-time for results. We discussed the implications of a negative, positive and/or variant of uncertain significant result. We offered Brian Farmer the option to pursue  genetic testing through a pan-cancer panel and discuss a smaller more targeted panel.  He elected to have a smaller panel including colorectal cancer and leukemia panel.   Colorectal Panel: APC, AXIN2, BMPR1A, BUB1B, CDH1, CHEK2, EPCAM, GREM1, MLH1, MSH2, MSH3, MLH3,  MSH6, MUTYH, NTHL1, PMS2, POLD1, POLE, PTEN, RSP20, RNF43, SMAD4, STK11, TP53, GALNT12 Myelodysplastic Farmer/Leukemia Panel: ATM BLM CEBPA EPCAM GATA2 HRAS MLH1 MSH2 MSH6 NBN NF1 PMS2 RUNX1 TERC TERT TP53  We discussed Brian Farmer/ Brian Farmer.  Brian Farmer is caused by mutations in the genes: MLH1, MSH2, MSH6, PMS2 and EPCAM.  This Farmer increases the risk for colon, uterine, ovarian stomach cancers, as  well as others.  We discussed that Brian Farmer is a subtype Brian Farmer in which we see sebaceous adenomas, sebaceous epithelioma, sebaceous carcinoma, and keratoakanthoma.  Based on his description of his encounter, it seems he has had some of these findings in the past.  I recommended he continue to follow-up with his dermatologist regarding these findings.   We discussed recommended management for Brian Farmer.   We discussed that there are several other genes that are associated with an increased risk for colon cancer and increased polyp burden (MUTYH, APC, POLE, CHEK2, etc.) We also dicussed that there are many genes that cause many different types of cancer risks.    We discussed that if he is found to have a mutation in one of these genes, it may impact future medical management recommendations such as increased cancer screenings and consideration of risk reducing surgeries.  A positive result could also have implications for the patient's family members.  A Negative result would mean we did not identify a Brian Farmer (or other cancer predisposition) mutation.  A negative result would make Brian Farmer/Muir Torre unlikely. However, genetic testing is not perfect.  There can still be mutations that are undetectable  by current technology, or in genes not yet tested or identified to increase cancer risk.  Therefore, genetic testing cannot definitively rule out [employer].  He should still follow-up with his providers regarding any clinical symptoms/features and family history.   We discussed the potential to find a Variant of Uncertain Significance or VUS.  These are variants that have not yet been identified as pathogenic or benign, and it is unknown if this variant is associated with increased cancer risk or if this is a normal finding.  Most VUS's are reclassified to benign or likely benign.   It should not be used to make medical management decisions. With time, we suspect the lab will determine the significance of any VUS's identified if any.   Based on Brian Farmer personal and family history of cancer, he meets medical criteria for genetic testing. Despite that he meets criteria, he may still have an out of pocket cost. The laboratory can provide an estimate of his OOP cost.  hospital was provided the contact information for the laboratory if hospital has further questions.   We discussed that some people do not want to undergo genetic testing due to fear of genetic discrimination.  A federal law called the Genetic Information Non-Discrimination Act (GINA) of 2008 helps protect individuals against genetic discrimination based on their genetic test results.  It impacts both health insurance and employment.  For health insurance, it protects against increased premiums, being kicked off insurance or being forced to take a test in order to be insured.  For employment it protects against hiring, firing and promoting decisions based on genetic test results.  Health status due to a cancer diagnosis is not protected under GINA.  This law does not protect life insurance, disability insurance, or other types of insurance.   PLAN: After considering the risks, benefits, and limitations, Brian Farmer  provided  informed consent to pursue genetic testing and the blood sample was sent to [facility] for analysis of the Colon Cancer Panel + Myelodysplastic Farmer/Leukemia Panel. Results should be available within approximately 2-3 weeks' time, at which point they will be disclosed by telephone to Brian Farmer, as will any additional recommendations warranted by these results. Brian Farmer will receive a summary of his genetic counseling visit and a copy  of his results once available. This information will also be available in Epic. We encouraged Brian Farmer to remain in contact with cancer genetics annually so that we can continuously update the family history and inform him of any changes in cancer genetics and testing that may be of benefit for his family. Brian Farmer questions were answered to his satisfaction today. Our contact information was provided should additional questions or concerns arise.  Based on Mr. Crisanto family history, we recommended his nephew, who was diagnosed with colon cancer at age 61, have genetic counseling and testing. Brian Farmer will let us know if we can be of any assistance in coordinating genetic counseling and/or testing for this family member.   Lastly, we encouraged Brian Farmer to remain in contact with cancer genetics annually so that we can continuously update the family history and inform him of any changes in cancer genetics and testing that may be of benefit for this family.   Mr.  Farmer questions were answered to his satisfaction today. Our contact information was provided should additional questions or concerns arise. Thank you for the referral and allowing [place] to share in the care of your patient.   Tana Felts, MS, [facility] Certified Genetic Counselor [email or web address] phone: [phone]  The patient was seen for a total of 40 minutes in face-to-face genetic counseling.

## 2018-10-06 ENCOUNTER — Telehealth: Payer: Self-pay | Admitting: Genetics

## 2018-10-07 NOTE — Telephone Encounter (Signed)
Revealed Negative genetic testing.    This result does not support a diagnosis of Brian Farmer.  However, genetic testing is not perfect and in rare cases can miss a mutation- therefore in certain situations, a person may still receive a clinical diagnosis of Brian Farmer even if genetic testing does not identify a mutation if the patient's personal and family history his highly suspicious.   However, it is not clear if Brian Farmer has such a personal/family history that warrants Brian Farmer management given his negative results.    We have been unable at this point in time to review records from his initial dermatology encounter from which he was given the diagnosis of Brian Farmer.  (we will try to obtain these records).  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   Brian Farmer does have a nephew with rectal cancer dx at 2, but otherwise does not have a family history suggestive of Brian Farmer.    At this point of time, we question if Brian Farmer has a dx of Brian Farmer.  Further clarification is needed regarding the reasons for his initial dx.    Brian Farmer will look up records to let us know the name of the dermatology office and/or dermatologist he saw.  We will then follow-up and try to obtain records to clarify.  We will send these results to his referring GI doctor, Brian Farmer as well so he can review in deciding GI management for Brian Farmer.

## 2018-10-20 ENCOUNTER — Telehealth: Payer: Self-pay | Admitting: Genetics

## 2018-10-20 NOTE — Telephone Encounter (Signed)
Shared with Mr. Gutman that we were able to obtain records from his dermatologist.  It seems from these records they were suspicious for Muirr Mervyn Gay due to 2 sebaceous lesions being identified (1 sebaceous adenoma and 1 sebaceous neoplasm).  They recommended colonoscopy due to this suspicion, but no genetic, molecular, or IHC testing was performed on the tumor.   From the records we obtained, it does not appear the dermatologist gave a clinical diagnosis of Muir-Torre, but was suspicious.    We will send a copy of Mr. Sickinger' test results and his dermatology records to him per request.  We will also scan these records into his chart and share with is GI doctor.   He said he would provide me the name of his current dermatologist so we can share these with him as well.

## 2018-11-05 ENCOUNTER — Other Ambulatory Visit: Payer: Self-pay | Admitting: Urology

## 2018-11-05 ENCOUNTER — Telehealth: Payer: Self-pay | Admitting: Urology

## 2018-11-05 ENCOUNTER — Encounter: Payer: Self-pay | Admitting: Genetics

## 2018-11-05 ENCOUNTER — Ambulatory Visit: Payer: Self-pay | Admitting: Genetics

## 2018-11-05 DIAGNOSIS — Z800 Family history of malignant neoplasm of digestive organs: Secondary | ICD-10-CM

## 2018-11-05 DIAGNOSIS — Z806 Family history of leukemia: Secondary | ICD-10-CM

## 2018-11-05 DIAGNOSIS — Z1379 Encounter for other screening for genetic and chromosomal anomalies: Secondary | ICD-10-CM | POA: Insufficient documentation

## 2018-11-05 DIAGNOSIS — D492 Neoplasm of unspecified behavior of bone, soft tissue, and skin: Secondary | ICD-10-CM | POA: Insufficient documentation

## 2018-11-05 HISTORY — DX: Neoplasm of unspecified behavior of bone, soft tissue, and skin: D49.2

## 2018-11-05 NOTE — Progress Notes (Signed)
HPI:  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  Please refer to our prior cancer genetics clinic note for more information regarding Brian Farmer's medical, social and family histories, and our assessment and recommendations, at the time. Brian Farmer recent genetic test results were disclosed to him, as well as recommendations warranted by these results. These results and recommendations are discussed in more detail below.  CANCER HISTORY:   No history exists.     FAMILY HISTORY:  We obtained a detailed, 4-generation family history.  Significant diagnoses are listed below: Family History  Problem Relation Age of Onset  . Hypertension Mother   . Cancer Mother 35       bone marrow  . Hypertension Brother   . Leukemia Father 43  . Leukemia Brother 53  . Colon cancer Other 54  . Prostate cancer Neg Hx   . Kidney cancer Neg Hx   . Kidney disease Neg Hx    Brian Farmer has 2 daughters and 3 sons (ages range from 39-41) with no hx of cancer.  None of them have had colonoscopies.  He has 3 grandchildren with no hx of cancer. Brian Farmer has 4 brothers and 2 sisters in their 45's/70's. 1 brother had leukemia dx at 28.  One brother's son (patient's nephew) was dx with colon cancer at 68.  He is not aware of any genetic testing for him.   Brian Farmer's father: died at 35 due to leukemia.  Paternal Aunts/Uncles: 4 paternal uncles with no hx of cancer.  All lived to be over 87.   Paternal cousins: no known cancer hx Paternal grandfather: unk Paternal grandmother:no hx of cancer  Brian Farmer mother: died at 78 due to bone marrow cancer.  She was an only child.  Maternal grandfather: unk Maternal grandmother:unk  Brian Farmer is unaware of previous family history of genetic testing for hereditary cancer risks. Patient's  maternal ancestors are of [place] descent, and paternal ancestors are of [place] descent. There is no reported Ashkenazi Jewish ancestry. There is no known consanguinity.  GENETIC TEST RESULTS: Genetic testing performed through [facility] Colorectal panel _ Myelodysplastic Syndrome/Leukemia panel reported out on 10/05/2018 showed no pathogenic mutations. The Colorectal cancer Panel + Myelodysplastic syndrome/Leukemia Panel was ordered (35 genes): APC, ATM, AXIN2, BLM, BMPR1A, BUB1B, CDH1, CEBPA, CHEK2, EPCAM*, GALNT12, GATA2, GREM1*, HRAS, MLH1, MLH3, MSH2, MSH3, MSH6, MUTYH, NBN, NF1, NTHL1, PMS2, POLD1, POLE, PTEN, RNF43, RPS20, RUNX1, SMAD4, STK11, TERC, TERT, TP53.  The test report will be scanned into EPIC and will be located under the Molecular Pathology section of the Results Review tab. A portion of the result report is included below for reference.     We discussed with Brian Farmer that because current genetic testing is not perfect, it is possible there may be a gene mutation in one of these genes that current testing cannot detect, but that chance is small.  We also discussed, that there could be another gene that has not yet been discovered, or that we have not yet tested, that is responsible for the cancer diagnoses in the family. It is also possible there is a hereditary cause for the cancer in the family that Brian Farmer did not inherit and therefore was not identified in his testing.  Therefore, it is important to remain in touch  with cancer genetics in the future so that we can continue to offer Brian Farmer the most up to date genetic testing.   ADDITIONAL GENETIC TESTING: We discussed with Brian Farmer that his genetic testing was fairly extensive.  If there are are genes identified to increase cancer risk that can be analyzed in the future, we would be happy to discuss and coordinate this testing at that time.    CANCER SCREENING RECOMMENDATIONS: This negative result means that we did not  identify a genetic predisposition to cancer in Brian Farmer. This result does NOT support a diagnosis of Muir Torre/Lynch Syndrome.    However, genetic testing is not perfect and in rare cases can miss a mutation- therefore in certain situations, a person may still receive a clinical diagnosis of Lynch syndrome even if genetic testing does not identify a mutation if the patient's personal and family history his highly suspicious.  Clinical judgment regarding the patient's personal and family history should be used when making these calls.  Aside from 84  Nephew with colon cancer in his 44's, Brian Farmer does not report any other family history that is suspicious for Lynch Syndrome.  We were able to obtain his records from his dermatology in June 2016.  He had a sebaceous adenoma on his lateral buccal cheek and a unusual cystic sebaceous neoplasm on his left eyebrow. 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   Muir-Torre syndrome is a historical term used to describe individuals presenting with a combination of sebaceous neoplasms of the skin and one or more internal cancers, commonly those seen in Lynch syndrome.  We discussed that sebaceous carcinomas/adenomas are rare neoplasms that begin in an oil or sweat gland. While these types of cancers can occur sporadically, they can also be associated with Lynch syndrome.  According to [place], about 2% (64 out of 3299) of patients with sebaceous skin lesions between 2000-2012 had Muir-Torre syndrome (Lynch syndrome) (PMID [id number]).  It is recommended he continue to follow the cancer management and screening guidelines provided by his oncology and primary healthcare provider. An individual's cancer risk is not determined by  genetic test results alone.  Overall cancer risk assessment includes additional factors such as personal medical history, family history, etc.  These should be used to make a personalized plan for cancer prevention and surveillance.     RECOMMENDATIONS FOR FAMILY MEMBERS:  Relatives in this family might be at some increased risk of developing cancer, over the general population risk, simply due to the family history of cancer.  We recommended women in this family have a yearly mammogram beginning at age 68, or 5 years younger than the earliest onset of cancer, an annual clinical breast exam, and perform monthly breast self-exams. Women in this family should also have a gynecological exam as recommended by their primary provider. All family members should have a colonoscopy as directed by their doctors.  All family members should inform their physicians about the family history of cancer so their doctors can make the most appropriate screening recommendations for them.   It is also possible there is a hereditary cause for the cancer in Brian Farmer family that he did not inherit and therefore was not identified in him.  Therefore, we recommended his nephew  with colon cancer dx at 41 also have genetic counseling and testing. Brian Farmer will let us know if we can be of any assistance in coordinating genetic counseling and/or testing for these family members.   FOLLOW-UP: Lastly, we discussed with Brian Farmer that cancer genetics is a rapidly advancing field and it is possible that new genetic tests will be appropriate for him and/or his family members in the future. We encouraged him to remain in contact with cancer genetics on an annual basis so we can update his personal and family histories and let him know of advances in cancer genetics that may benefit this family.   Our contact number was provided. Brian Farmer questions were answered to his satisfaction, and he knows he is welcome to call us at anytime with  additional questions or concerns.   Ferol Luz, MS, [facility] Certified Genetic Counselor [email or web address]

## 2018-11-05 NOTE — Telephone Encounter (Signed)
Patient states that he is no longer taking this medication and this was not by his request. Refill was refused

## 2018-11-05 NOTE — Telephone Encounter (Signed)
There is a refill request for Flomax, but per Dr. Cherrie Gauze last note - he felt the medication wasn't helping and he was to come back for a TRUS/cysto.   Is he wanting the refill?

## 2018-12-20 ENCOUNTER — Ambulatory Visit: Payer: BLUE CROSS/BLUE SHIELD | Admitting: Gastroenterology

## 2018-12-20 ENCOUNTER — Encounter: Payer: Self-pay | Admitting: Gastroenterology

## 2018-12-22 ENCOUNTER — Encounter: Payer: Self-pay | Admitting: Gastroenterology

## 2018-12-22 ENCOUNTER — Ambulatory Visit: Payer: BLUE CROSS/BLUE SHIELD | Admitting: Gastroenterology

## 2018-12-22 VITALS — BP 128/72 | HR 67 | Ht 68.0 in | Wt 172.4 lb

## 2018-12-22 DIAGNOSIS — K580 Irritable bowel syndrome with diarrhea: Secondary | ICD-10-CM

## 2018-12-22 NOTE — Progress Notes (Signed)
Jonathon Bellows MD, MRCP(U.K) [address]  [place]  [place], [place] [address]  Main: [phone]  Fax: [phone]   Primary Care Physician: [name], MD  Primary Gastroenterologist:  Dr. Jonathon Bellows   No chief complaint on file.   HPI: Brian Farmer is a 63 y.o. male   Summary of history : He is being followed for abdominal pain and diarrhea.   He was initially referred and seen on 06/30/2018 by Dr. Ellison Hughs for abdominal pain. He has a history of type 2 diabetes, GERD. Last colonoscopy in May 2018 by Dr. Vira Agar had a rectal polyp that was excised. He did have LA grade a esophagitis seen on his upper endoscopy.  H. pylori stool antigen negative. 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   At his initial visit he was on long-term NSAIDs, consumed a lot of artificial sugars which I felt contributed to his diarrhea bloating and abdominal pain which she subsequently stopped and had improvement of his symptoms.  I also felt that his abdominal pain was secondary to NSAID use which he also had resolved at his last appointment.   07/02/18 : GI stool PCR, C diff PCR,fecal calprotectin- negative Says while he took Xifaxan had no diarrhea and when he stopped the antibiotics had diarrhea. Took 14 days of xifaxan. Had to take 2 inmodiums - since has not had any diarrhea.  Bentyl did not work .   Interval history11/05/2018-12/22/2018   Last visit diarrhea had stopped. Forgot about the LOW FODMAP diet, diarrhea returned slowly 3 weeks back, Friday he says that he had a severe episode. Prior meal was fried chicken.  Since that episode , in the evenings has more gas and discomfort. Friday had to take 2 imodiums and it helped. Monday also took one. Genetic testing completed and he has has no syndromes .  Sometimes has paun RUQ after meals.   Current Outpatient Medications  Medication Sig Dispense Refill  . amLODipine  (NORVASC) 5 MG tablet Take 5 mg by mouth daily.    . Cholecalciferol (VITAMIN D-1000 MAX ST) 1000 units tablet Take by mouth.    . cyanocobalamin (,VITAMIN B-12,) 1000 MCG/ML injection   1  . dicyclomine (BENTYL) 10 MG capsule Take 1 capsule (10 mg total) by mouth 4 (four) times daily -  before meals and at bedtime. 90 capsule 0  . fluticasone (FLONASE) 50 MCG/ACT nasal spray Place into both nostrils daily.    . insulin aspart (NOVOLOG) 100 UNIT/ML FlexPen Inject upto 70u per day    . Insulin Glargine (TOUJEO SOLOSTAR) 300 UNIT/ML SOPN Inject 50 Units into the skin 3 (three) times daily before meals.    Marland Kitchen lisinopril (PRINIVIL,ZESTRIL) 40 MG tablet Take 40 mg by mouth daily.    Marland Kitchen omeprazole (PRILOSEC) 20 MG capsule Take 20 mg by mouth daily.    [name] DELICA LANCETS 54S MISC by Does not apply route.    . ranitidine (ZANTAC) 150 MG tablet Take 1 tablet (150 mg total) by mouth 2 (two) times daily. (Patient not taking: Reported on 09/09/2018) 60 tablet 2  . rosuvastatin (CRESTOR) 40 MG tablet   3  . sildenafil (REVATIO) 20 MG tablet Take 1 tablet (20 mg total) by mouth as needed. Take 1-5 tabs as needed prior to intercourse 30 tablet 11  . tamsulosin (FLOMAX) 0.4 MG CAPS capsule TAKE 1 CAPSULE BY MOUTH EVERY DAY 90 capsule 0  . XIFAXAN 550 MG  TABS tablet TK 1 T PO TID FOR 14 DAYS  0   No current facility-administered medications for this visit.     Allergies as of 12/22/2018 - Review Complete 09/09/2018  Allergen Reaction Noted  . Naproxen Nausea Only 07/20/2016  . Vicodin [hydrocodone-acetaminophen] Nausea Only 08/01/2015    ROS:  General: Negative for anorexia, weight loss, fever, chills, fatigue, weakness. ENT: Negative for hoarseness, difficulty swallowing , nasal congestion. CV: Negative for chest pain, angina, palpitations, dyspnea on exertion, peripheral edema.  Respiratory: Negative for dyspnea at rest, dyspnea on exertion, cough, sputum, wheezing.  GI: See history of present  illness. GU:  Negative for dysuria, hematuria, urinary incontinence, urinary frequency, nocturnal urination.  Endo: Negative for unusual weight change.    Physical Examination:   There were no vitals taken for this visit.  General: Well-nourished, well-developed in no acute distress.  Eyes: No icterus. Conjunctivae pink. Mouth: Oropharyngeal mucosa moist and pink , no lesions erythema or exudate. Lungs: Clear to auscultation bilaterally. Non-labored. Heart: Regular rate and rhythm, no murmurs rubs or gallops.  Abdomen: Bowel sounds are normal, nontender, nondistended, no hepatosplenomegaly or masses, no abdominal bruits or hernia , no rebound or guarding.   Extremities: No lower extremity edema. No clubbing or deformities. Neuro: Alert and oriented x 3.  Grossly intact. Skin: Warm and dry, no jaundice.   Psych: Alert and cooperative, norm al mood and affect.   Imaging Studies: No results found.  Assessment and Plan:   Brian Farmer is a 63 y.o. y/o male here to follow up forpossible  IBS-Dwhich improved significantly after a course of Xifaxan previously.   Plan 1.Stop all artificial sugars, LOW FODMAP diet - he will try today  2. If symptoms  No better  try Xifaxan for another 14 days , possibly can do breath test before , also will consider HIDA scan down the line  3. Check TSH and celiac serology.   Dr Jonathon Bellows  MD,MRCP [facility]) Follow up PRN

## 2018-12-23 LAB — CELIAC DISEASE AB SCREEN W/RFX
Antigliadin Abs, IgA: 4 units (ref 0–19)
IgA/Immunoglobulin A, Serum: 170 mg/dL (ref 61–437)
Transglutaminase IgA: <2 U/mL (ref 0–3)

## 2018-12-23 LAB — TSH: TSH: 2.63 u[IU]/mL (ref 0.450–4.500)

## 2018-12-26 ENCOUNTER — Encounter: Payer: Self-pay | Admitting: Gastroenterology

## 2018-12-31 ENCOUNTER — Other Ambulatory Visit: Payer: Self-pay | Admitting: Gastroenterology

## 2019-01-04 NOTE — Telephone Encounter (Signed)
Pt is calling to check on refill status of rx xifaxan 550 mg tablet.

## 2019-01-24 ENCOUNTER — Encounter: Payer: Self-pay | Admitting: Urology

## 2019-06-27 ENCOUNTER — Other Ambulatory Visit: Payer: Self-pay

## 2019-06-27 ENCOUNTER — Ambulatory Visit (INDEPENDENT_AMBULATORY_CARE_PROVIDER_SITE_OTHER): Payer: BC Managed Care – PPO | Admitting: Gastroenterology

## 2019-06-27 ENCOUNTER — Encounter

## 2019-06-27 VITALS — BP 129/73 | HR 78 | Temp 98.4°F | Ht 68.0 in | Wt 169.0 lb

## 2019-06-27 DIAGNOSIS — K580 Irritable bowel syndrome with diarrhea: Secondary | ICD-10-CM

## 2019-06-27 NOTE — Progress Notes (Signed)
Jonathon Bellows MD, MRCP(U.K) [address]  [place]  [place], [place] [address]  Main: [phone]  Fax: [phone]   Primary Care Physician: [name], MD  Primary Gastroenterologist:  Dr. Jonathon Bellows   No chief complaint on file.   HPI: Brian Farmer is a 63 y.o. male   Summary of history : He is being followed for abdominal pain and diarrhea.  He was initially referred and seen on 06/30/2018 by Dr. Ellison Hughs for abdominal pain. He has a history of type 2 diabetes, GERD. Last colonoscopy in May 2018 by Dr. Vira Agar had a rectal polyp that was excised. He did have LA grade a esophagitis seen on his upper endoscopy. H. pylori stool antigen negative. His gallbladder is intact.Genetic testing completed and he has has no syndromes .   At his initial visit he was on long-term NSAIDs, consumed a lot of artificial sugars which I felt contributed to his diarrhea bloating and abdominal pain which she subsequently stopped and had improvement of his symptoms. I also felt that his abdominal pain was secondary to NSAID use .Says while he took Xifaxan had no diarrhea and when he stopped the antibiotics had diarrhea. Took 14 days of xifaxan. Had to take 2 inmodiums - since has not had any diarrhea. Bentyl did not work .    07/02/18 : GI stool PCR, C diff PCR,fecal calprotectin- negative   Interval history2/19/2020-06/27/2019 Commencing on the low FODMAP diet he feels much better.  He has noticed that when he consumes wheat he has had watery bowel movement subsequently and abdominal discomfort.  He was concerned if he is allergic to wheat.  I did review his prior lab results and noted that he has tested negative for celiac disease.  He maintains his diet and symptoms on an app on his phone.  I did note that he probably also has intolerance to lactose-containing foods  And foods with high fructose corn syrup    Current Outpatient Medications  Medication Sig  Dispense Refill  . amLODipine (NORVASC) 5 MG tablet Take 5 mg by mouth daily.    . Cholecalciferol (VITAMIN D-1000 MAX ST) 1000 units tablet Take by mouth.    . cyanocobalamin (,VITAMIN B-12,) 1000 MCG/ML injection   1  . dicyclomine (BENTYL) 10 MG capsule Take 1 capsule (10 mg total) by mouth 4 (four) times daily -  before meals and at bedtime. (Patient not taking: Reported on 12/22/2018) 90 capsule 0  . fluticasone (FLONASE) 50 MCG/ACT nasal spray Place into both nostrils daily.    . insulin aspart (NOVOLOG) 100 UNIT/ML FlexPen Inject upto 70u per day    . Insulin Glargine (TOUJEO SOLOSTAR) 300 UNIT/ML SOPN Inject 50 Units into the skin 3 (three) times daily before meals.    Marland Kitchen lisinopril (PRINIVIL,ZESTRIL) 40 MG tablet Take 40 mg by mouth daily.    Marland Kitchen omeprazole (PRILOSEC) 20 MG capsule Take 20 mg by mouth daily.    [name] DELICA LANCETS [id number] MISC by Does not apply route.    . ranitidine (ZANTAC) 150 MG tablet Take 1 tablet (150 mg total) by mouth 2 (two) times daily. (Patient not taking: Reported on 09/09/2018) 60 tablet 2  . rosuvastatin (CRESTOR) 40 MG tablet   3  . sildenafil (REVATIO) 20 MG tablet Take 1 tablet (20 mg total) by mouth as needed. Take 1-5 tabs as needed prior to intercourse (Patient not taking: Reported on 12/22/2018) 30 tablet 11  . tamsulosin (FLOMAX) 0.4 MG CAPS  capsule TAKE 1 CAPSULE BY MOUTH EVERY DAY 90 capsule 0   No current facility-administered medications for this visit.     Allergies as of 06/27/2019 - Review Complete 09/09/2018  Allergen Reaction Noted  . Naproxen Nausea Only 07/20/2016  . Vicodin [hydrocodone-acetaminophen] Nausea Only 08/01/2015    ROS:  General: Negative for anorexia, weight loss, fever, chills, fatigue, weakness. ENT: Negative for hoarseness, difficulty swallowing , nasal congestion. CV: Negative for chest pain, angina, palpitations, dyspnea on exertion, peripheral edema.  Respiratory: Negative for dyspnea at rest, dyspnea on  exertion, cough, sputum, wheezing.  GI: See history of present illness. GU:  Negative for dysuria, hematuria, urinary incontinence, urinary frequency, nocturnal urination.  Endo: Negative for unusual weight change.    Physical Examination:   There were no vitals taken for this visit.  General: Well-nourished, well-developed in no acute distress.  Eyes: No icterus. Conjunctivae pink. Mouth: Oropharyngeal mucosa moist and pink , no lesions erythema or exudate. Lungs: Clear to auscultation bilaterally. Non-labored. Heart: Regular rate and rhythm, no murmurs rubs or gallops.  Abdomen: Bowel sounds are normal, nontender, nondistended, no hepatosplenomegaly or masses, no abdominal bruits or hernia , no rebound or guarding.   Extremities: No lower extremity edema. No clubbing or deformities. Neuro: Alert and oriented x 3.  Grossly intact. Skin: Warm and dry, no jaundice.   Psych: Alert and cooperative, normal mood and affect.   Imaging Studies: No results found.  Assessment and Plan:   Brian Farmer is a 63 y.o. y/o male  here to follow up forpossible  IBS-Dwhich improved significantly after a course of Xifaxan previously.   Doing well on a low FODMAP diet concerns for wheat intolerance  Plan 1.  Check for food allergies  Dr Jonathon Bellows  MD,MRCP [facility]) Follow up in as needed

## 2019-06-29 ENCOUNTER — Encounter: Payer: Self-pay | Admitting: Gastroenterology

## 2019-06-29 LAB — FOOD ALLERGY PROFILE
Allergen Corn, IgE: <0.1 kU/L
Clam IgE: <0.1 kU/L
Codfish IgE: <0.1 kU/L
Egg White IgE: <0.1 kU/L
Milk IgE: <0.1 kU/L
Peanut IgE: <0.1 kU/L
Scallop IgE: <0.1 kU/L
Sesame Seed IgE: <0.1 kU/L
Shrimp IgE: <0.1 kU/L
Soybean IgE: <0.1 kU/L
Walnut IgE: <0.1 kU/L
Wheat IgE: <0.1 kU/L

## 2019-06-29 NOTE — Progress Notes (Signed)
Inform allergy testing was negative- he had issues with bread- try baking own load of bread , he may have issues with the other ingredients other than wheat

## 2019-06-30 ENCOUNTER — Telehealth: Payer: Self-pay

## 2019-06-30 NOTE — Telephone Encounter (Signed)
Called pt to inform him of lab results and Dr. Georgeann Oppenheim suggestions.  Unable to contact, LVM to return call Will send MyChart message.

## 2019-06-30 NOTE — Telephone Encounter (Signed)
-----   Message from Jonathon Bellows, MD sent at 06/29/2019 12:40 PM EDT ----- Inform allergy testing was negative- he had issues with bread- try baking own load of bread , he may have issues with the other ingredients other than wheat

## 2019-07-13 ENCOUNTER — Other Ambulatory Visit: Payer: Self-pay

## 2019-07-13 MED ORDER — RIFAXIMIN 550 MG PO TABS: 550.0000 mg | ORAL_TABLET | Freq: Three times a day (TID) | ORAL | 0 refills | Status: AC

## 2019-08-01 ENCOUNTER — Ambulatory Visit
Admission: EM | Admit: 2019-08-01 | Discharge: 2019-08-01 | Disposition: A | Discharge status: Home / Self Care | Payer: BC Managed Care – PPO

## 2019-08-01 ENCOUNTER — Other Ambulatory Visit: Payer: Self-pay

## 2019-08-01 DIAGNOSIS — H00012 Hordeolum externum right lower eyelid: Secondary | ICD-10-CM | POA: Diagnosis not present

## 2019-08-01 MED ORDER — ERYTHROMYCIN 5 MG/GM OP OINT: TOPICAL_OINTMENT | OPHTHALMIC | 0 refills | Status: DC

## 2019-08-01 MED ORDER — DOXYCYCLINE HYCLATE 100 MG PO CAPS: 100.0000 mg | ORAL_CAPSULE | Freq: Two times a day (BID) | ORAL | 0 refills | Status: DC

## 2019-08-01 NOTE — ED Triage Notes (Signed)
Pt reports lower lid right eye stye X 1 week, worsening pain over last few days. Pt alert and oriented X4, cooperative, RR even and unlabored, color WNL. Pt in NAD. Denies vision changes.

## 2019-08-01 NOTE — ED Provider Notes (Signed)
[facility]    CSN: [id number] Arrival date & time: 08/01/19  1612      History   Chief Complaint Chief Complaint  Patient presents with  . Eye Pain    HPI Brian Farmer is a 63 y.o. male.   HPI  63 year old male presents with right lower lid " stye".  He states that the pain is worse in the last few days.  He has been using over-the-counter stye preparations without much relief.  Today was the first day he noticed any photosensitivity to sunlight but not to ambient light.  Has had no visual changes.  Denies any fever or chills.  No pain with motion of the eye.  He has noticed some slight swelling of the lower lid more than the last few days.        Past Medical History:  Diagnosis Date  . Allergic rhinitis   . BPH (benign prostatic hyperplasia)   . Cervical disc disease   . DDD (degenerative disc disease), cervical   . Diabetes mellitus without complication ([place])   . Erectile disorder 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   . Family history of colon cancer   . Family history of leukemia   . Family history of leukemia   . GERD (gastroesophageal reflux disease)   . Hyperlipidemia   . Hypertension   . Lynch syndrome   . Pancreatitis   . 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 Genetic testing 11/05/2018  . 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 Family history of leukemia   . Family history of colon cancer   . Cervical radiculitis 06/21/2015  . DDD (degenerative disc disease), cervical 06/21/2015  . Enlarged prostate with urinary obstruction 01/19/2015  . Cervical back pain with evidence of disc disease 01/19/2015  . Essential hypertension 01/19/2015  . Gastroesophageal reflux disease without esophagitis 01/19/2015  . Hyperlipidemia, mixed 01/19/2015  . Type 2 diabetes mellitus, uncontrolled ([place]) 01/19/2015    Past Surgical History:   Procedure Laterality Date  . ANKLE SURGERY    . COLONOSCOPY WITH PROPOFOL N/A 08/02/2015   Procedure: COLONOSCOPY WITH PROPOFOL;  Surgeon: Hulen Luster, MD;  Location: [facility] ENDOSCOPY;  Service: Gastroenterology;  Laterality: N/A;  . COLONOSCOPY WITH PROPOFOL N/A 03/04/2017   Procedure: COLONOSCOPY WITH PROPOFOL;  Surgeon: Manya Silvas, MD;  Location: [facility] ENDOSCOPY;  Service: Endoscopy;  Laterality: N/A;  . ESOPHAGOGASTRODUODENOSCOPY (EGD) WITH PROPOFOL N/A 03/04/2017   Procedure: ESOPHAGOGASTRODUODENOSCOPY (EGD) WITH PROPOFOL;  Surgeon: Manya Silvas, MD;  Location: [facility] ENDOSCOPY;  Service: Endoscopy;  Laterality: N/A;  . SHOULDER SURGERY    . TRIGGER FINGER RELEASE Bilateral        Home Medications    Prior to Admission medications   Medication Sig Start Date End Date Taking? Authorizing Provider  amLODipine (NORVASC) 5 MG tablet Take 5 mg by mouth daily.   Yes [provider]  fluticasone (FLONASE) 50 MCG/ACT nasal spray Place into both nostrils daily.   Yes [provider]  insulin aspart (NOVOLOG) 100 UNIT/ML FlexPen Inject upto 70u per day 05/29/16  Yes [provider]  Insulin Glargine (TOUJEO SOLOSTAR) 300 UNIT/ML SOPN Inject 50 Units into the skin 3 (three) times daily before meals.   Yes [provider]  lisinopril (PRINIVIL,ZESTRIL) 40 MG tablet Take 40 mg by mouth daily.   Yes [provider]  rifaximin Doreene Nest)  200 MG tablet Take 200 mg by mouth 3 (three) times daily.   Yes [provider]  rosuvastatin (CRESTOR) 40 MG tablet  03/06/18  Yes [provider]  doxycycline (VIBRAMYCIN) 100 MG capsule Take 1 capsule (100 mg total) by mouth 2 (two) times daily. 08/01/19   Lorin Picket, PA-C  erythromycin ophthalmic ointment Place a 1/2 inch ribbon of ointment into the lower eyelid 4 times daily for 7 days 08/01/19   Lorin Picket, PA-C  [facility] DELICA LANCETS 99991111 MISC by Does not apply route.    [provider]  tadalafil (CIALIS) 5 MG tablet Take by mouth. 04/26/19 04/25/20  [provider]  dicyclomine (BENTYL) 10 MG capsule Take 1 capsule (10 mg total) by mouth 4 (four) times daily -  before meals and at bedtime. Patient not taking: Reported on 12/22/2018 07/29/18 08/01/19  Jonathon Bellows, MD  omeprazole (PRILOSEC) 20 MG capsule Take 20 mg by mouth daily.  08/01/19  [provider]  ranitidine (ZANTAC) 150 MG tablet Take 1 tablet (150 mg total) by mouth 2 (two) times daily. Patient not taking: Reported on 09/09/2018 06/30/18 08/01/19  Jonathon Bellows, MD    Family History Family History  Problem Relation Age of Onset  . Hypertension Mother   . Cancer Mother 78       bone marrow  . Hypertension Brother   . Leukemia Father 56  . Leukemia Brother 101  . Colon cancer Other 59  . Prostate cancer Neg Hx   . Kidney cancer Neg Hx   . Kidney disease Neg Hx     Social History Social History   Tobacco Use  . Smoking status: Former Research scientist (life sciences)  . Smokeless tobacco: Never Used  Substance Use Topics  . Alcohol use: Yes    Alcohol/week: 8.0 standard drinks    Types: 7 Cans of beer, 1 Shots of liquor per week    Comment: occasional  . Drug use: No     Allergies   Naproxen and Vicodin [hydrocodone-acetaminophen]   Review of Systems Review of Systems  Constitutional: Negative for activity change, appetite change, chills, fatigue and fever.  Eyes: Positive for photophobia and pain. Negative for discharge, redness, itching and visual disturbance.  All other systems reviewed and are negative.    Physical Exam Triage Vital Signs ED Triage Vitals  Enc Vitals Group     BP 08/01/19 1647 137/64     Pulse Rate 08/01/19 1647 67     Resp 08/01/19 1647 16     Temp 08/01/19 1647 98.2 F (36.8 C)     Temp Source 08/01/19 1647 Oral     SpO2 08/01/19 1647 100 %     Weight 08/01/19 1648 165 lb (74.8 kg)     Height 08/01/19 1648 5\' 8"  (1.727 m)     Head Circumference --      Peak  Flow --      Pain Score 08/01/19 1647 8     Pain Loc --      Pain Edu? --      Excl. in [place]? --    No data found.  Updated Vital Signs BP 137/64 (BP Location: Right Arm)   Pulse 67   Temp 98.2 F (36.8 C) (Oral)   Resp 16   Ht 5\' 8"  (1.727 m)   Wt 165 lb (74.8 kg)   SpO2 100%   BMI 25.09 kg/m   Visual Acuity Right Eye Distance: 20/25 Left Eye Distance: 20/25  Bilateral Distance: 20/25  Right Eye Near:   Left Eye Near:    Bilateral Near:     Physical Exam Vitals signs and nursing note reviewed.  Constitutional:      General: He is not in acute distress.    Appearance: Normal appearance. He is normal weight. He is not ill-appearing, toxic-appearing or diaphoretic.  HENT:     Head: Normocephalic.  Eyes:     Extraocular Movements: Extraocular movements intact.     Conjunctiva/sclera: Conjunctivae normal.     Pupils: Pupils are equal, round, and reactive to light.     Comments: Examination of the right lower eyelid shows a 2 mm erythematous round swelling with a punctate middle is slightly purulent.  The area is tender and warm.  He does have swelling of the lower lid.  There is no orbital tenderness present.  EOMs are intact and full and comfortable.  PERRLA.  Eversion of the lower lid reveals swelling behind the external swelling but not nearly as bad.  Conjunctiva is normal.  Neck:     Musculoskeletal: Normal range of motion and neck supple. No neck rigidity or muscular tenderness.  Cardiovascular:     Rate and Rhythm: Normal rate and regular rhythm.     Heart sounds: Normal heart sounds.  Pulmonary:     Effort: Pulmonary effort is normal.     Breath sounds: Normal breath sounds.  Musculoskeletal: Normal range of motion.  Lymphadenopathy:     Cervical: No cervical adenopathy.  Skin:    General: Skin is warm and dry.  Neurological:     General: No focal deficit present.     Mental Status: He is alert and oriented to person, place, and time.  Psychiatric:         Mood and Affect: Mood normal.        Behavior: Behavior normal.      UC Treatments / Results  Labs (all labs ordered are listed, but only abnormal results are displayed) Labs Reviewed - No data to display  EKG   Radiology No results found.  Procedures Procedures (including critical care time)  Medications Ordered in UC Medications - No data to display  Initial Impression / Assessment and Plan / UC Course  I have reviewed the triage vital signs and the nursing notes.  Pertinent labs & imaging results that were available during my care of the patient were reviewed by me and considered in my medical decision making (see chart for details).   63 year old male presents with a stye on the lower right eye lid that appears external.  Has been using over-the-counter preparations for styes but has not been successful.  Has been present for 7 to 10days.  The pain has been worsening recently.  He experienced first photosensitivity to sunlight today is not sensitive to ambient light.  The patient is not squinting.  Conjunctiva is normal.  Vision is normal.  We will have him apply warm compresses for 5 to 10 minutes at a time 3-5 times daily.  Also apply erythromycin ointment to the lower lid.  Because of the advanced amount of swelling I will place him on oral doxycycline for 5 days.  If he is not noticing any improvement in the next 2 days he will make an appointment with [facility] for possible I&D.   Final Clinical Impressions(s) / UC Diagnoses   Final diagnoses:  Hordeolum externum of right lower eyelid     Discharge Instructions     Apply  warm compresses as described times daily for 5 minutes at a time.  Take medications as prescribed.  If not improved by 2 days follow-up with ophthalmology at [facility] eye.    ED Prescriptions    Medication Sig Dispense Auth. Provider   doxycycline (VIBRAMYCIN) 100 MG capsule Take 1 capsule (100 mg total) by mouth 2 (two) times daily. 10  capsule Lorin Picket, PA-C   erythromycin ophthalmic ointment Place a 1/2 inch ribbon of ointment into the lower eyelid 4 times daily for 7 days 3.5 g Lorin Picket, PA-C     PDMP not reviewed this encounter.   Lorin Picket, PA-C 08/01/19 Bosie Helper

## 2019-08-01 NOTE — Discharge Instructions (Signed)
Apply warm compresses as described times daily for 5 minutes at a time.  Take medications as prescribed.  If not improved by 2 days follow-up with ophthalmology at [facility] eye.

## 2019-09-16 DIAGNOSIS — E538 Deficiency of other specified B group vitamins: Secondary | ICD-10-CM | POA: Insufficient documentation

## 2019-09-16 DIAGNOSIS — E109 Type 1 diabetes mellitus without complications: Secondary | ICD-10-CM | POA: Insufficient documentation

## 2019-09-16 DIAGNOSIS — E785 Hyperlipidemia, unspecified: Secondary | ICD-10-CM | POA: Insufficient documentation

## 2019-10-25 ENCOUNTER — Other Ambulatory Visit: Payer: Self-pay

## 2019-10-25 ENCOUNTER — Ambulatory Visit: Payer: BC Managed Care – PPO | Attending: Family Medicine

## 2019-10-25 DIAGNOSIS — Z20822 Contact with and (suspected) exposure to covid-19: Secondary | ICD-10-CM

## 2019-10-26 LAB — NOVEL CORONAVIRUS, NAA: SARS-CoV-2, NAA: NOT DETECTED

## 2019-10-26 LAB — SPECIMEN STATUS REPORT

## 2019-11-09 NOTE — Telephone Encounter (Signed)
Spoke with pt and informed him of Dr. Georgeann Oppenheim recommendation. Pt agrees and has been scheduled.

## 2019-11-10 ENCOUNTER — Ambulatory Visit (INDEPENDENT_AMBULATORY_CARE_PROVIDER_SITE_OTHER): Payer: BC Managed Care – PPO | Admitting: Gastroenterology

## 2019-11-10 DIAGNOSIS — R197 Diarrhea, unspecified: Secondary | ICD-10-CM | POA: Diagnosis not present

## 2019-11-10 DIAGNOSIS — R109 Unspecified abdominal pain: Secondary | ICD-10-CM

## 2019-11-10 DIAGNOSIS — K580 Irritable bowel syndrome with diarrhea: Secondary | ICD-10-CM

## 2019-11-10 MED ORDER — VIBERZI 75 MG PO TABS: 75.0000 mg | ORAL_TABLET | Freq: Two times a day (BID) | ORAL | 5 refills | Status: DC

## 2019-11-10 NOTE — Addendum Note (Signed)
Addended by: Dorethea Clan on: 11/10/2019 11:37 AM   Modules accepted: Orders

## 2019-11-10 NOTE — Progress Notes (Signed)
Brian Farmer , MD [address]  [place]  [place], [place] [address]  Main: [phone]  Fax: [phone]   Primary Care Physician: [name], MD  Virtual Visit via Telephone Note  I connected with patient on 11/10/19 at 11:15 AM EST by telephone and verified that I am speaking with the correct person using two identifiers.   I discussed the limitations, risks, security and privacy concerns of performing an evaluation and management service by telephone and the availability of in person appointments. I also discussed with the patient that there may be a patient responsible charge related to this service. The patient expressed understanding and agreed to proceed.  Location of Patient: Home Location of Provider: Home Persons involved: Patient and provider only   History of Present Illness:  Table bowel syndrome follow-up HPI: Brian Farmer is a 64 y.o. male   Summary of history : He is being followed for abdominal pain and diarrhea.  He was initially referred and seen on 06/30/2018 by Dr. Ellison Hughs for abdominal pain. He has a history of type 2 diabetes, GERD. Last colonoscopy in May 2018 by Dr. Vira Agar had a rectal polyp that was excised. He did have LA grade a esophagitis seen on his upper endoscopy. H. pylori stool antigen negative. His gallbladder is intact.Genetic testing completed and he has has no syndromes .   At his initial visit he was on long-term NSAIDs, consumed a lot of artificial sugars which I felt contributed to his diarrhea bloating and abdominal pain which she subsequently stopped and had improvement of his symptoms. I also felt that his abdominal pain was secondary to NSAID use .Says while he took Xifaxan had no diarrhea and when he stopped the antibiotics had diarrhea. Took 14 days of xifaxan. Had to take 2 imodiums - since has not had any diarrhea. Bentyl did not work . Low FODMAP diet has helped him in the past.  Week has  made his bowel movements watery.  Tested negative for celiac disease.  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  Similar to foods with high fructose corn syrup.  Testing for food allergies was negative.   07/02/18 : GI stool PCR, C diff PCR,fecal calprotectin- negative   Interval history 06/27/2019-11/10/2019  He mentions that after the last course of Xifaxan felt no improvement at all.  He does have a gallbladder.  He does have abdominal pain prior to a bowel movement and completely relieved after.    Current Outpatient Medications  Medication Sig Dispense Refill  . amLODipine (NORVASC) 5 MG tablet Take 5 mg by mouth daily.    Marland Kitchen doxycycline (VIBRAMYCIN) 100 MG capsule Take 1 capsule (100 mg total) by mouth 2 (two) times daily. 10 capsule 0  . erythromycin ophthalmic ointment Place a 1/2 inch ribbon of ointment into the lower eyelid 4 times daily for 7 days 3.5 g 0  . fluticasone (FLONASE) 50 MCG/ACT nasal spray Place into both nostrils daily.    . insulin aspart (NOVOLOG) 100 UNIT/ML FlexPen Inject upto 70u per day    . Insulin Glargine (TOUJEO SOLOSTAR) 300 UNIT/ML SOPN Inject 50 Units into the skin 3 (three) times daily before meals.    Marland Kitchen lisinopril (PRINIVIL,ZESTRIL) 40 MG tablet Take 40 mg by mouth daily.    [name] DELICA LANCETS 99991111 MISC by Does not apply route.    . rifaximin (XIFAXAN) 200 MG tablet Take 200 mg by mouth 3 (three) times daily.    . rosuvastatin (  CRESTOR) 40 MG tablet   3  . tadalafil (CIALIS) 5 MG tablet Take by mouth.     No current facility-administered medications for this visit.    Allergies as of 11/10/2019 - Review Complete 08/01/2019  Allergen Reaction Noted  . Naproxen Nausea Only 07/20/2016  . Vicodin [hydrocodone-acetaminophen] Nausea Only 08/01/2015    Review of Systems:    All systems reviewed and negative except where noted in HPI.   Observations/Objective:  Labs: CMP  No results found for: NA, K, CL, CO2, GLUCOSE, BUN,  CREATININE, CALCIUM, PROT, ALBUMIN, AST, ALT, ALKPHOS, BILITOT, GFRNONAA, GFRAA No results found for: WBC, HGB, HCT, MCV, PLT  Imaging Studies: No results found.  Assessment and Plan:   Brian Farmer is a 64 y.o. y/o male  here to follow up forpossibleIBS-Dwhich improved significantly after a course of Xifaxan, subsequently symptoms recurred and did not respond to Xifaxan.  Checked for food allergies which are negative.  No evidence of celiac disease.  He does have a gallbladder.  Discussed about a trial of Viberzi, discussed that it is occasionally associated with pancreatitis.  Advised him to start on a weekday on a Monday.  If there is any abdominal pain to stop the medication right away.  Samples will be provided.  Follow-up visit over the telephone in 2 to 3 weeks time.      I discussed the assessment and treatment plan with the patient. The patient was provided an opportunity to ask questions and all were answered. The patient agreed with the plan and demonstrated an understanding of the instructions.   The patient was advised to call back or seek an in-person evaluation if the symptoms worsen or if the condition fails to improve as anticipated.   Dr Brian Bellows MD,MRCP [facility]) Gastroenterology/Hepatology Pager: [phone]   Speech recognition software was used to dictate this note.

## 2019-11-30 ENCOUNTER — Encounter: Payer: Self-pay | Admitting: Gastroenterology

## 2019-11-30 ENCOUNTER — Ambulatory Visit (INDEPENDENT_AMBULATORY_CARE_PROVIDER_SITE_OTHER): Payer: BC Managed Care – PPO | Admitting: Gastroenterology

## 2019-11-30 DIAGNOSIS — K580 Irritable bowel syndrome with diarrhea: Secondary | ICD-10-CM | POA: Diagnosis not present

## 2019-11-30 MED ORDER — VIBERZI 75 MG PO TABS: 75.0000 mg | ORAL_TABLET | Freq: Two times a day (BID) | ORAL | 1 refills | Status: AC

## 2019-11-30 NOTE — Progress Notes (Signed)
Brian Farmer , MD [address]  [place]  [place], [place] [address]  Main: [phone]  Fax: [phone]   Primary Care Physician: [name], MD  Virtual Visit via Telephone Note  I connected with patient on 11/30/19 at  9:15 AM EST by telephone and verified that I am speaking with the correct person using two identifiers.   I discussed the limitations, risks, security and privacy concerns of performing an evaluation and management service by telephone and the availability of in person appointments. I also discussed with the patient that there may be a patient responsible charge related to this service. The patient expressed understanding and agreed to proceed.  Location of Patient: Home Location of Provider: Home Persons involved: Patient and provider only   History of Present Illness: Chief Complaint  Patient presents with  . Follow-up    IBS-D    HPI: Brian Farmer is a 64 y.o. male    Summary of history : He is being followed for abdominal pain and diarrhea.  He was initially referred and seen on 06/30/2018 by Dr. Ellison Hughs for abdominal pain. He has a history of type 2 diabetes, GERD. Last colonoscopy in May 2018 by Dr. Vira Agar had a rectal polyp that was excised. He did have LA grade a esophagitis seen on his upper endoscopy. H. pylori stool antigen negative. His gallbladder is intact.Genetic testing completed and he has has no syndromes .   At his initial visit he was on long-term NSAIDs, consumed a lot of artificial sugars which I felt contributed to his diarrhea bloating and abdominal pain which she subsequently stopped and had improvement of his symptoms. I also felt that his abdominal pain was secondary to NSAID use.Says while he took Xifaxan had no diarrhea and when he stopped the antibiotics had diarrhea. Took 14 days of xifaxan. Had to take 2 imodiums - since has not had any diarrhea. Bentyl did not work . Low FODMAP diet  has helped him in the past.  Week has made his bowel movements watery.  Tested negative for celiac disease.  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  Similar to foods with high fructose corn syrup.  Testing for food allergies was negative.   07/02/18 : GI stool PCR, C diff PCR,fecal calprotectin- negative   Interval history 11/10/2019-11/30/2019  At his last visit he mentioned that he had failed Xifaxan and we commenced him on Viberzi 75 mg twice a day and since then he feels much better.  No complaints at this time and wants a 90-day supply of the medication.   Current Outpatient Medications  Medication Sig Dispense Refill  . amLODipine (NORVASC) 5 MG tablet Take 5 mg by mouth daily.    . B-D ULTRAFINE III SHORT PEN 31G X 8 MM MISC 4 (four) times daily. as directed    . Eluxadoline (VIBERZI) 75 MG TABS Take 75 mg by mouth 2 (two) times daily. 60 tablet 5  . fluticasone (FLONASE) 50 MCG/ACT nasal spray Place into both nostrils daily.    . insulin aspart (NOVOLOG) 100 UNIT/ML FlexPen Inject upto 70u per day    . Insulin Glargine (TOUJEO SOLOSTAR) 300 UNIT/ML SOPN Inject 50 Units into the skin 3 (three) times daily before meals.    . Insulin Pen Needle (FIFTY50 PEN NEEDLES) 31G X 8 MM MISC 4 (four) times daily    . lisinopril (PRINIVIL,ZESTRIL) 40 MG tablet Take 40 mg by mouth daily.    [name] DELICA LANCETS  33G MISC by Does not apply route.    . pantoprazole (PROTONIX) 40 MG tablet Take 40 mg by mouth daily.    . rosuvastatin (CRESTOR) 40 MG tablet   3  . tadalafil (CIALIS) 5 MG tablet Take by mouth.    . doxycycline (VIBRAMYCIN) 100 MG capsule Take 1 capsule (100 mg total) by mouth 2 (two) times daily. (Patient not taking: Reported on 11/10/2019) 10 capsule 0  . erythromycin ophthalmic ointment Place a 1/2 inch ribbon of ointment into the lower eyelid 4 times daily for 7 days (Patient not taking: Reported on 11/10/2019) 3.5 g 0  . rifaximin (XIFAXAN) 200 MG tablet Take 200 mg by  mouth 3 (three) times daily.    . tamsulosin (FLOMAX) 0.4 MG CAPS capsule Take by mouth.     No current facility-administered medications for this visit.    Allergies as of 11/30/2019 - Review Complete 11/30/2019  Allergen Reaction Noted  . Naproxen Nausea Only 07/20/2016  . Vicodin [hydrocodone-acetaminophen] Nausea Only 08/01/2015    Review of Systems:    All systems reviewed and negative except where noted in HPI.   Observations/Objective:  Labs: CMP  No results found for: NA, K, CL, CO2, GLUCOSE, BUN, CREATININE, CALCIUM, PROT, ALBUMIN, AST, ALT, ALKPHOS, BILITOT, GFRNONAA, GFRAA No results found for: WBC, HGB, HCT, MCV, PLT  Imaging Studies: No results found.  Assessment and Plan:   Brian Farmer is a 64 y.o. y/o male here to follow up forpossibleIBS-Dwhich improved significantly after a course of Xifaxan, subsequently symptoms recurred and did not respond to Xifaxan.  Checked for food allergies which are negative.  No evidence of celiac disease.  He does have a gallbladder.    Commenced on Viberzi 75 mg twice daily at his last visit and since then feels significantly better.  He has requested a 90-day supply.   I discussed the assessment and treatment plan with the patient. The patient was provided an opportunity to ask questions and all were answered. The patient agreed with the plan and demonstrated an understanding of the instructions.   The patient was advised to call back or seek an in-person evaluation if the symptoms worsen or if the condition fails to improve as anticipated.  I provided 8 minutes of non-face-to-face time during this encounter.  Dr Brian Bellows MD,MRCP [facility]) Gastroenterology/Hepatology Pager: [phone]   Speech recognition software was used to dictate this note.

## 2020-02-08 ENCOUNTER — Other Ambulatory Visit: Payer: Self-pay | Admitting: Physical Medicine and Rehabilitation

## 2020-02-08 DIAGNOSIS — M5416 Radiculopathy, lumbar region: Secondary | ICD-10-CM

## 2020-02-08 DIAGNOSIS — M5441 Lumbago with sciatica, right side: Secondary | ICD-10-CM

## 2020-03-04 ENCOUNTER — Other Ambulatory Visit: Payer: Self-pay

## 2020-03-04 ENCOUNTER — Ambulatory Visit
Admission: EM | Admit: 2020-03-04 | Discharge: 2020-03-04 | Disposition: A | Discharge status: Home / Self Care | Payer: BC Managed Care – PPO | Attending: Emergency Medicine | Admitting: Emergency Medicine

## 2020-03-04 ENCOUNTER — Encounter: Payer: Self-pay | Admitting: Emergency Medicine

## 2020-03-04 ENCOUNTER — Ambulatory Visit (INDEPENDENT_AMBULATORY_CARE_PROVIDER_SITE_OTHER): Payer: BC Managed Care – PPO

## 2020-03-04 DIAGNOSIS — M25562 Pain in left knee: Secondary | ICD-10-CM

## 2020-03-04 DIAGNOSIS — M25532 Pain in left wrist: Secondary | ICD-10-CM | POA: Diagnosis not present

## 2020-03-04 NOTE — ED Triage Notes (Signed)
Patient states that he did a fast sprint on Wed.  Patient states that the next day he had swelling and pain in his left knee.

## 2020-03-04 NOTE — Discharge Instructions (Signed)
Rest. Ice. Elevate. Over the counter knee sleeve.   Follow up with orthopedic this week, as discussed. See above.   Follow up with your primary care physician this week as needed. Return to Urgent care for new or worsening concerns.

## 2020-03-04 NOTE — ED Provider Notes (Signed)
[facility] ____________________________________________  Time seen: Approximately 2:33 PM  I have reviewed the triage vital signs and the nursing notes.   HISTORY  Chief Complaint Knee Pain (left)  HPI Brian Farmer is a 64 y.o. male presenting for evaluation of left knee pain present for the last 1 week.  Patient also reports he was having milder pain throughout the week, but reports Thursday he went to stand and pivot to his left and had increase of pain to his medial knee.  States now having pain with ambulation and activity.  Denies giving way sensation, clicking or popping.  Denies pain radiation or paresthesias or skin changes.  Denies history of the same.  Reports he is very active person with frequent exercise.  Did try a few doses of over-the-counter NSAIDs without resolution, no continued over-the-counter medication.  Denies recent fevers, cough, chest pain or shortness of breath or recent sickness.  [name], MD : PCP    Past Medical History:  Diagnosis Date  . Allergic rhinitis   . BPH (benign prostatic hyperplasia)   . Cervical disc disease   . DDD (degenerative disc disease), cervical   . Diabetes mellitus without complication ([place])   . Erectile disorder 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   . Family history of colon cancer   . Family history of leukemia   . Family history of leukemia   . GERD (gastroesophageal reflux disease)   . Hyperlipidemia   . Hypertension   . Lynch syndrome   . Pancreatitis   . 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 B12 deficiency 09/16/2019  . Hyperlipidemia due to type 1 diabetes mellitus ([place]) 09/16/2019  . Type 1 diabetes mellitus without complication ([place]) [id number]  . Genetic testing 11/05/2018  . 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 Family history of leukemia   . Family history of colon  cancer   . Cervical radiculitis 06/21/2015  . DDD (degenerative disc disease), cervical 06/21/2015  . Enlarged prostate with urinary obstruction 01/19/2015  . Cervical back pain with evidence of disc disease 01/19/2015  . Essential hypertension 01/19/2015  . Gastroesophageal reflux disease without esophagitis 01/19/2015  . Hyperlipidemia, mixed 01/19/2015  . Type 2 diabetes mellitus, uncontrolled ([place]) 01/19/2015    Past Surgical History:  Procedure Laterality Date  . ANKLE SURGERY    . COLONOSCOPY WITH PROPOFOL N/A 08/02/2015   Procedure: COLONOSCOPY WITH PROPOFOL;  Surgeon: Hulen Luster, MD;  Location: [facility] ENDOSCOPY;  Service: Gastroenterology;  Laterality: N/A;  . COLONOSCOPY WITH PROPOFOL N/A 03/04/2017   Procedure: COLONOSCOPY WITH PROPOFOL;  Surgeon: Manya Silvas, MD;  Location: [facility] ENDOSCOPY;  Service: Endoscopy;  Laterality: N/A;  . ESOPHAGOGASTRODUODENOSCOPY (EGD) WITH PROPOFOL N/A 03/04/2017   Procedure: ESOPHAGOGASTRODUODENOSCOPY (EGD) WITH PROPOFOL;  Surgeon: Manya Silvas, MD;  Location: [facility] ENDOSCOPY;  Service: Endoscopy;  Laterality: N/A;  . SHOULDER SURGERY    . TRIGGER FINGER RELEASE Bilateral      No current facility-administered medications for this encounter.  Current Outpatient Medications:  .  amLODipine (NORVASC) 5 MG tablet, Take 5 mg by mouth daily., Disp: , Rfl:  .  fluticasone (FLONASE) 50 MCG/ACT nasal spray, Place into both nostrils daily., Disp: , Rfl:  .  insulin aspart (NOVOLOG) 100 UNIT/ML FlexPen, Inject upto 70u per day, Disp: , Rfl:  .  Insulin Glargine (TOUJEO SOLOSTAR) 300 UNIT/ML SOPN, Inject 50  Units into the skin 3 (three) times daily before meals., Disp: , Rfl:  .  lisinopril (PRINIVIL,ZESTRIL) 40 MG tablet, Take 40 mg by mouth daily., Disp: , Rfl:  .  pantoprazole (PROTONIX) 40 MG tablet, Take 40 mg by mouth daily., Disp: , Rfl:  .  rosuvastatin (CRESTOR) 40 MG tablet, , Disp: , Rfl: 3 .  B-D ULTRAFINE III SHORT PEN 31G X 8 MM MISC, 4  (four) times daily. as directed, Disp: , Rfl:  .  doxycycline (VIBRAMYCIN) 100 MG capsule, Take 1 capsule (100 mg total) by mouth 2 (two) times daily. (Patient not taking: Reported on 11/10/2019), Disp: 10 capsule, Rfl: 0 .  erythromycin ophthalmic ointment, Place a 1/2 inch ribbon of ointment into the lower eyelid 4 times daily for 7 days (Patient not taking: Reported on 11/10/2019), Disp: 3.5 g, Rfl: 0 .  Insulin Pen Needle (FIFTY50 PEN NEEDLES) 31G X 8 MM MISC, 4 (four) times daily, Disp: , Rfl:  .  ONETOUCH DELICA LANCETS [id number] MISC, by Does not apply route., Disp: , Rfl:  .  rifaximin (XIFAXAN) 200 MG tablet, Take 200 mg by mouth 3 (three) times daily., Disp: , Rfl:  .  tadalafil (CIALIS) 5 MG tablet, Take by mouth., Disp: , Rfl:  .  tamsulosin (FLOMAX) 0.4 MG CAPS capsule, Take by mouth., Disp: , Rfl:   Allergies Naproxen and Vicodin [hydrocodone-acetaminophen]  Family History  Problem Relation Age of Onset  . Hypertension Mother   . Cancer Mother 53       bone marrow  . Hypertension Brother   . Leukemia Father 43  . Leukemia Brother 17  . Colon cancer Other 78  . Prostate cancer Neg Hx   . Kidney cancer Neg Hx   . Kidney disease Neg Hx     Social History Social History   Tobacco Use  . Smoking status: Former Research scientist (life sciences)  . Smokeless tobacco: Never Used  Substance Use Topics  . Alcohol use: Yes    Alcohol/week: 8.0 standard drinks    Types: 7 Cans of beer, 1 Shots of liquor per week    Comment: occasional  . Drug use: No    Review of Systems Constitutional: No fever Cardiovascular: Denies chest pain. Respiratory: Denies shortness of breath. Gastrointestinal: No abdominal pain.  Musculoskeletal: Positive right knee pain. Skin: Negative for rash.   ____________________________________________   PHYSICAL EXAM:  VITAL SIGNS: ED Triage Vitals  Enc Vitals Group     BP 03/04/20 1408 133/73     Pulse Rate 03/04/20 1408 82     Resp 03/04/20 1408 16     Temp 03/04/20 1408  98.7 F (37.1 C)     Temp Source 03/04/20 1408 Oral     SpO2 03/04/20 1408 98 %     Weight 03/04/20 1405 168 lb (76.2 kg)     Height 03/04/20 1405 5\' 8"  (1.727 m)     Head Circumference --      Peak Flow --      Pain Score 03/04/20 1405 0     Pain Loc --      Pain Edu? --      Excl. in [place]? --     Constitutional: Alert and oriented. Well appearing and in no acute distress. Eyes: Conjunctivae are normal. ENT      Head: Normocephalic and atraumatic. Cardiovascular: Good peripheral circulation. Respiratory: Normal respiratory effort without tachypnea nor retractions. Musculoskeletal:  Bilateral pedal pulses equal and easily palpated.  Ambulatory with mildly antalgic  gait. Except: Left knee mild tenderness to medial aspect along medial collateral ligament, mild effusion noted, no point bony tenderness, no posterior or lateral tenderness, no pain with anterior posterior drawer test, no pain with medial or lateral stress, mild pain with full knee extension, no pain with knee flexion.  Left lower extremity otherwise nontender, no distal swelling or calf tenderness. Neurologic:  Normal speech and language. Skin:  Skin is warm, dry and intact. No rash noted. Psychiatric: Mood and affect are normal. Speech and behavior are normal. Patient exhibits appropriate insight and judgment   ___________________________________________   LABS (all labs ordered are listed, but only abnormal results are displayed)  Labs Reviewed - No data to display  RADIOLOGY  DG Knee Complete 4 Views Left  Result Date: 03/04/2020 CLINICAL DATA:  Pain and swelling LEFT knee after sprinting on Wednesday, pain especially medially EXAM: LEFT KNEE - COMPLETE 4+ VIEW COMPARISON:  None FINDINGS: Normal osseous mineralization. Joint spaces preserved. Probable minimal joint effusion. No acute fracture, dislocation, or bone destruction. Mild scattered atherosclerotic calcifications. IMPRESSION: Suspect minimal joint effusion. No  acute osseous abnormalities. Electronically Signed   By: Lavonia Dana M.D.   On: 03/04/2020 14:59   ____________________________________________   PROCEDURES Procedures    INITIAL IMPRESSION / ASSESSMENT AND PLAN / ED COURSE  Pertinent labs & imaging results that were available during my care of the patient were reviewed by me and considered in my medical decision making (see chart for details).  Well-appearing patient.  Left knee pain for last 1 week increase over the last few days.  Left knee x-ray as above radiologist, reviewed, negative for acute osseous abnormality, small joint effusion.  Concern for sprain versus ligamentous injury.  Recommend follow-up with orthopedic, supportive sleeve brace and ice.  Offered medication management, patient declined.  Monitor.  Discussed follow up and return parameters including no resolution or any worsening concerns. Patient verbalized understanding and agreed to plan.   ____________________________________________   FINAL CLINICAL IMPRESSION(S) / ED DIAGNOSES  Final diagnoses:  Acute pain of left knee     ED Discharge Orders    None       Note: This dictation was prepared with Dragon dictation along with smaller phrase technology. Any transcriptional errors that result from this process are unintentional.         Marylene Land, NP 03/04/20 1512

## 2020-03-05 ENCOUNTER — Ambulatory Visit
Admission: RE | Admit: 2020-03-05 | Discharge: 2020-03-05 | Disposition: A | Discharge status: Home / Self Care | Payer: BC Managed Care – PPO | Source: Ambulatory Visit | Attending: Physical Medicine and Rehabilitation | Admitting: Physical Medicine and Rehabilitation

## 2020-03-05 DIAGNOSIS — M5416 Radiculopathy, lumbar region: Secondary | ICD-10-CM

## 2020-03-05 DIAGNOSIS — M5441 Lumbago with sciatica, right side: Secondary | ICD-10-CM

## 2020-05-18 ENCOUNTER — Other Ambulatory Visit: Payer: Self-pay

## 2020-05-18 MED ORDER — VIBERZI 75 MG PO TABS: 1.0000 | ORAL_TABLET | Freq: Two times a day (BID) | ORAL | 1 refills | Status: DC

## 2020-09-17 IMAGING — MR MR LUMBAR SPINE W/O CM
5 series · 46 of 48 positions shown · non-contrast
Comparison: None.

CLINICAL DATA: Low back pain, right-sided sciatica.

EXAM:
MRI LUMBAR SPINE WITHOUT CONTRAST
TECHNIQUE: Multiplanar, multisequence MR imaging of the lumbar spine was
performed. No intravenous contrast was administered.

[Series 3: tirm sag · sagittal · 4.0mm · 0.55mm/px · 5 of 13 slices shown]
[im 1/13]
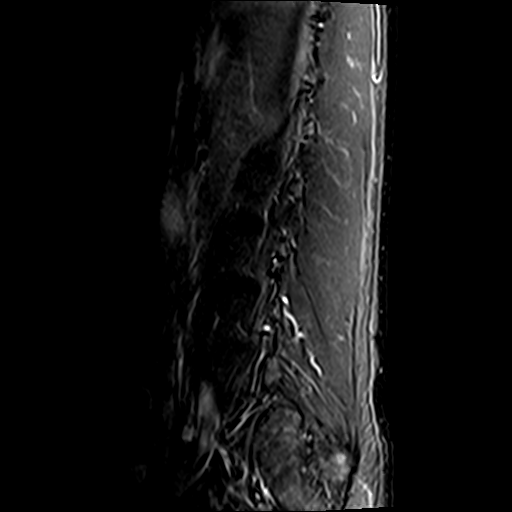
[im 4/13]
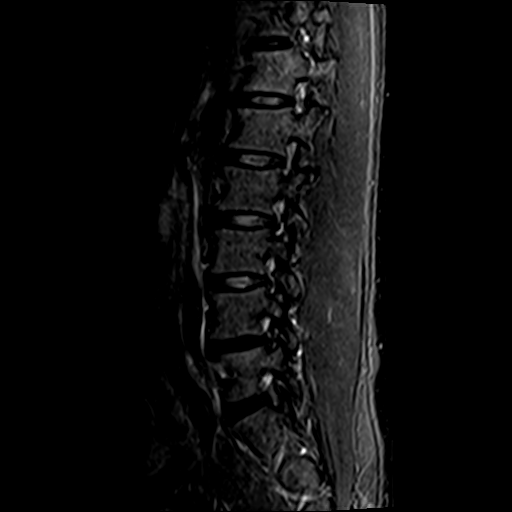
[im 7/13]
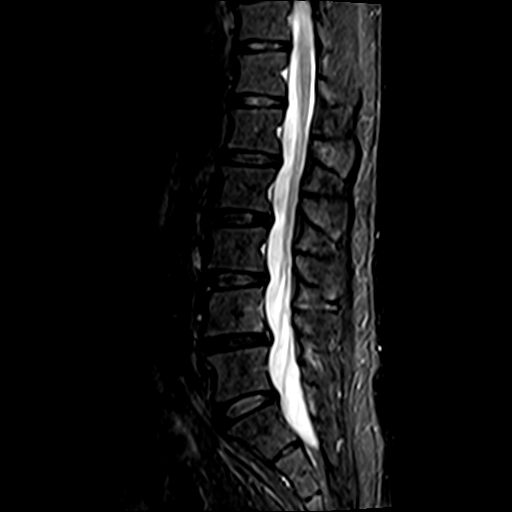
[im 10/13]
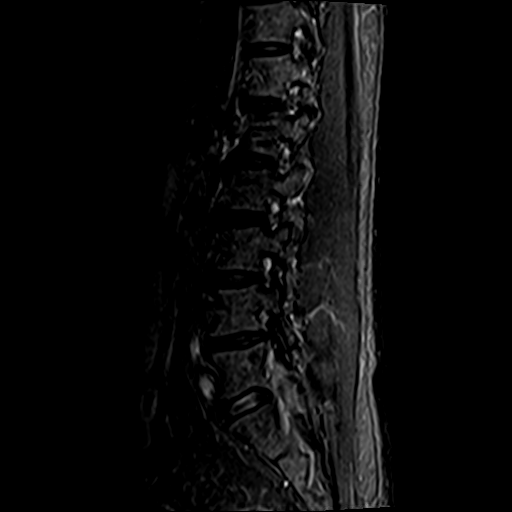
[im 13/13]
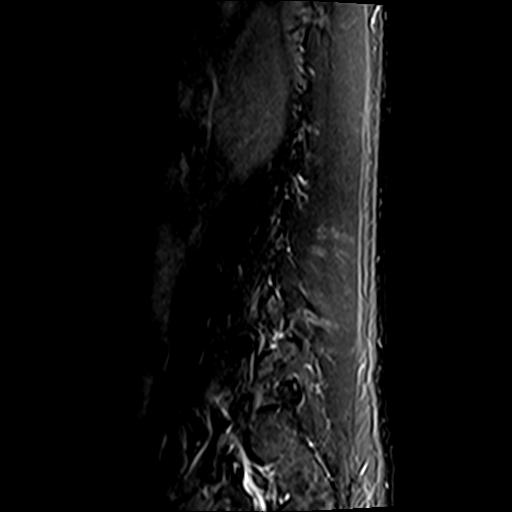

[Series 4: T2 · sagittal · 4.0mm · 0.88mm/px · 5 of 13 slices shown (1 of 2)]
[im 1/13]
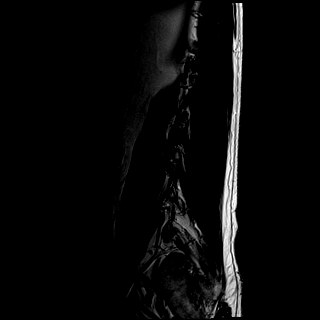
[im 4/13]
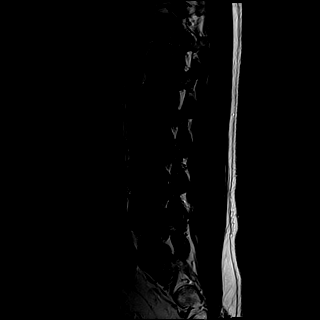
[im 7/13]
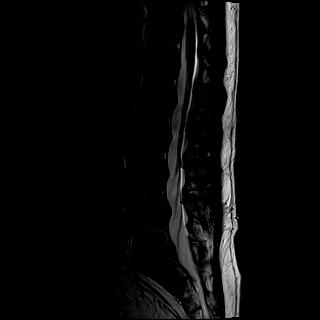
[im 10/13]
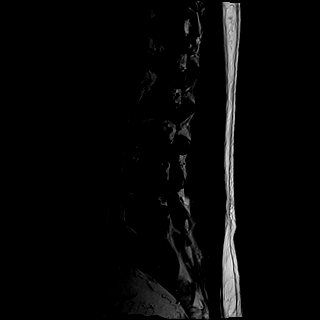
[im 13/13]
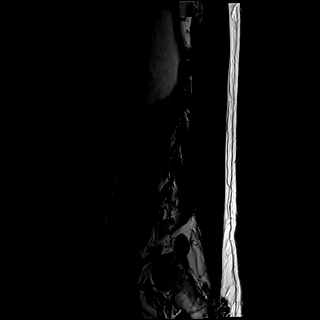

[Series 5: T1 · sagittal · 4.0mm · 0.88mm/px · 6 of 13 slices shown (1 of 2)]
[im 1/13]
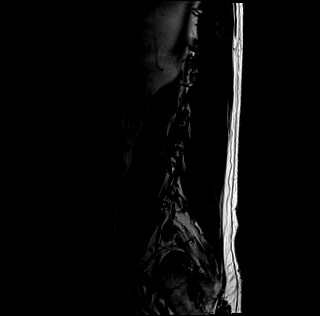
[im 3/13]
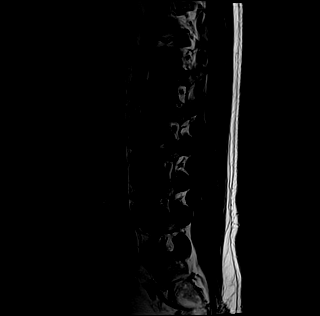
[im 5/13]
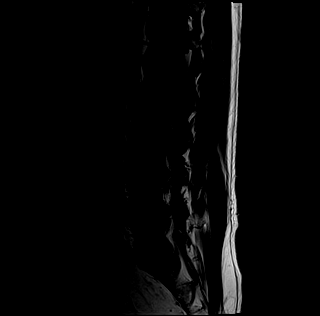
[im 8/13]
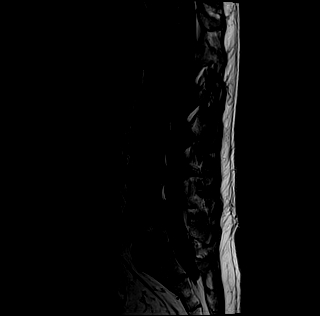
[im 10/13]
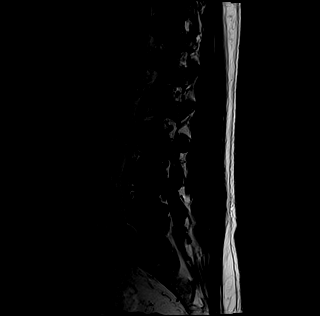
[im 13/13]
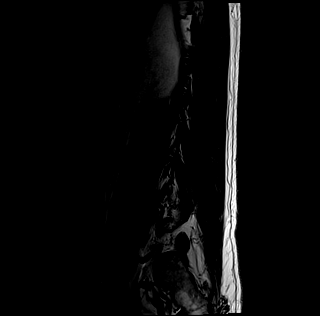

[Series 6: T1 · axial · 4.0mm · 0.78mm/px · z∈[-164,+37]mm · 14 of 37 slices shown (2 of 2)]
[im 1/37]
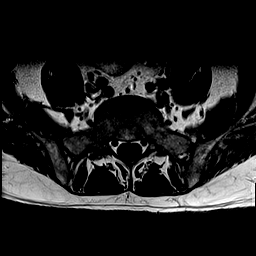
[im 3/37]
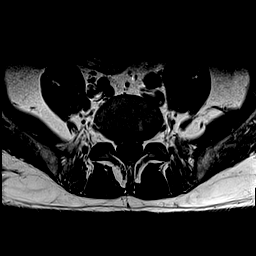
[im 5/37]
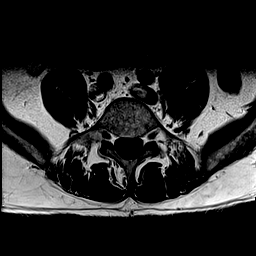
[im 8/37]
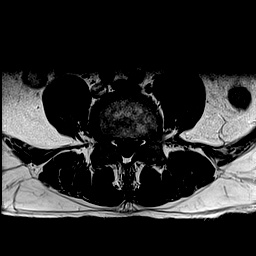
[im 10/37]
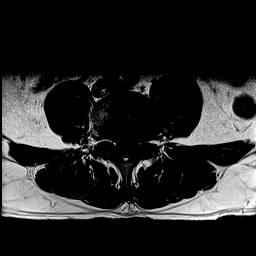
[im 13/37]
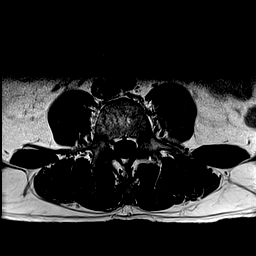
[im 15/37]
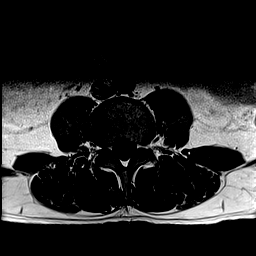
[im 17/37]
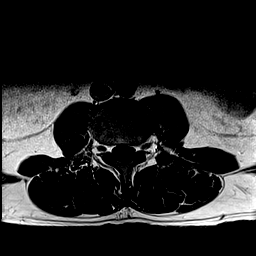
[im 20/37]
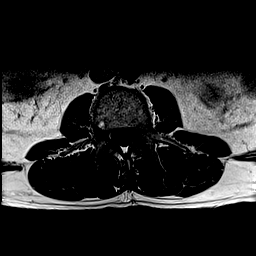
[im 22/37]
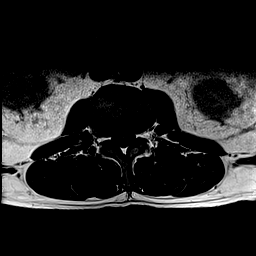
[im 25/37]
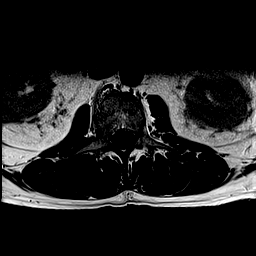
[im 27/37]
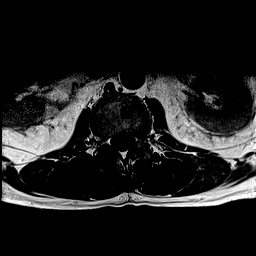
[im 32/37]
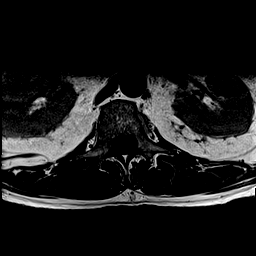
[im 37/37]
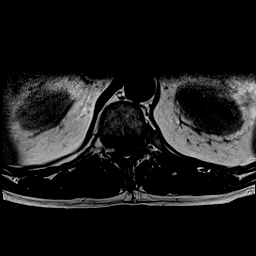

[Series 7: T2 · axial · 4.0mm · 0.78mm/px · z∈[-164,+37]mm · 16 of 37 slices shown (2 of 2)]
[im 1/37]
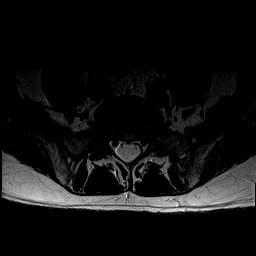
[im 3/37]
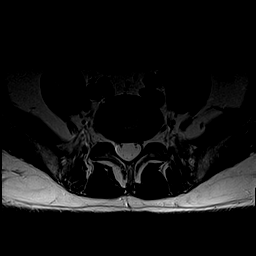
[im 5/37]
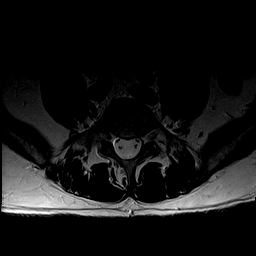
[im 8/37]
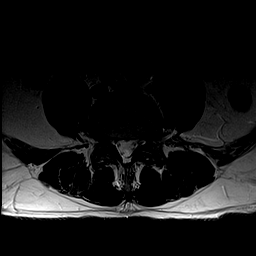
[im 10/37]
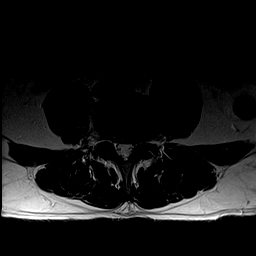
[im 13/37]
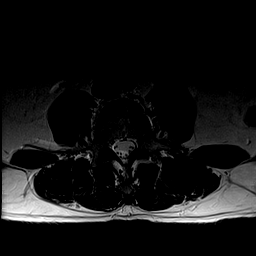
[im 15/37]
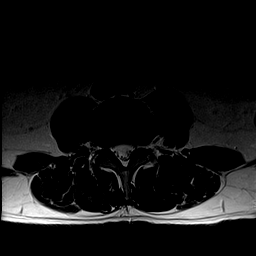
[im 17/37]
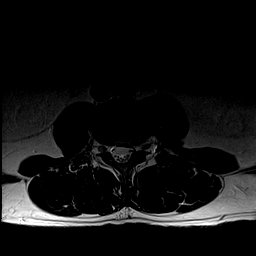
[im 20/37]
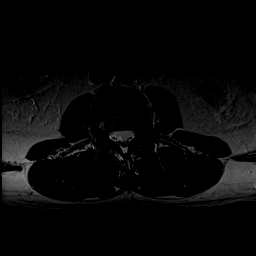
[im 22/37]
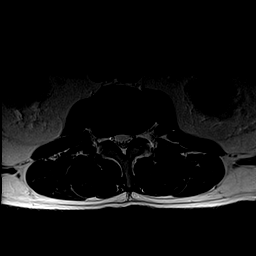
[im 25/37]
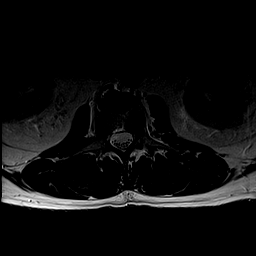
[im 27/37]
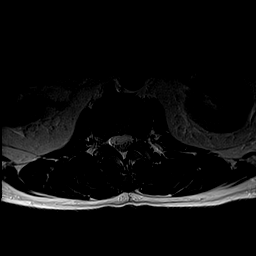
[im 29/37]
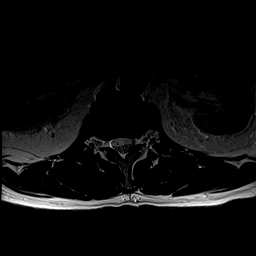
[im 32/37]
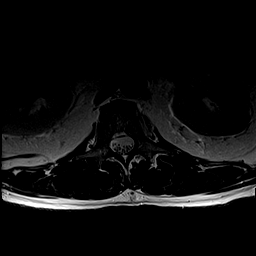
[im 34/37]
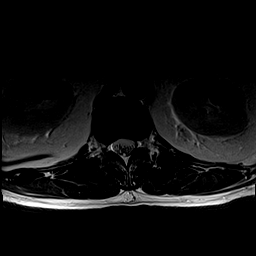
[im 37/37]
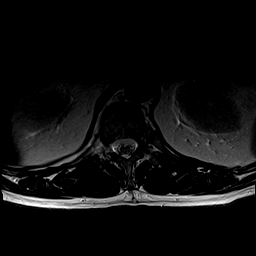

[46 of 48 positions shown; findings below may reference images not displayed]

FINDINGS: Segmentation:  Standard.

Alignment:  1-2 mm anterolisthesis of L4 on L5.

Vertebrae:  No fracture, evidence of discitis, or bone lesion.

Conus medullaris and cauda equina: Conus extends to the T12 level.
Conus and cauda equina appear normal.

Paraspinal and other soft tissues: No acute paraspinal abnormality.

Disc levels:

Disc spaces: Degenerative disease with mild disc height loss at
L4-5. Disc desiccation throughout the remainder of the lumbar spine.

T12-L1: No significant disc bulge. No evidence of neural foraminal
stenosis. No central canal stenosis.

L1-L2: Mild broad-based disc bulge. No evidence of neural foraminal
stenosis. No central canal stenosis.

L2-L3: Mild broad-based disc bulge with a more focal right foraminal
component. No evidence of neural foraminal stenosis. No central
canal stenosis.

L3-L4: Mild broad-based disc bulge. No evidence of neural foraminal
stenosis. No central canal stenosis.

L4-L5: Broad-based disc bulge with a right paracentral annular
fissure abutting the right intraspinal L5 nerve root. Bilateral
subarticular recess stenosis. Mild spinal stenosis. Mild bilateral
facet arthropathy. No foraminal narrowing.

L5-S1: No significant disc bulge. No evidence of neural foraminal
stenosis. No central canal stenosis.
IMPRESSION: 1. At L4-5 there is a broad-based disc bulge with a right
paracentral annular fissure abutting the right intraspinal L5 nerve
root. Bilateral subarticular recess stenosis. Mild spinal stenosis.
Mild bilateral facet arthropathy.
2. Lumbar spine spondylosis at multiple other levels as detailed
above.

## 2020-11-15 ENCOUNTER — Other Ambulatory Visit: Payer: Self-pay | Admitting: Gastroenterology

## 2020-11-15 MED ORDER — VIBERZI 75 MG PO TABS: 1.0000 | ORAL_TABLET | Freq: Two times a day (BID) | ORAL | 1 refills | Status: DC

## 2020-11-15 NOTE — Telephone Encounter (Signed)
Patient needs auth. on Viberzi for refill please.  Has 2 days left.  Call into [facility] in [place], [place]

## 2020-11-16 ENCOUNTER — Other Ambulatory Visit: Payer: Self-pay

## 2020-11-16 MED ORDER — VIBERZI 75 MG PO TABS: 1.0000 | ORAL_TABLET | Freq: Two times a day (BID) | ORAL | 1 refills | Status: DC

## 2020-11-16 NOTE — Progress Notes (Signed)
Resent Rx 

## 2021-01-30 ENCOUNTER — Other Ambulatory Visit: Payer: Self-pay

## 2021-01-30 ENCOUNTER — Telehealth: Payer: Self-pay | Admitting: Gastroenterology

## 2021-01-30 MED ORDER — VIBERZI 75 MG PO TABS: 1.0000 | ORAL_TABLET | Freq: Two times a day (BID) | ORAL | 1 refills | Status: DC

## 2021-01-30 NOTE — Telephone Encounter (Signed)
Rx has been sent to the pharmacy of request.

## 2021-01-30 NOTE — Telephone Encounter (Signed)
[place], [place]. He is traveling for another 6 weeks.  VIBERZI 75 MG TABS

## 2021-02-05 ENCOUNTER — Other Ambulatory Visit: Payer: Self-pay

## 2021-02-05 ENCOUNTER — Telehealth: Payer: Self-pay | Admitting: Gastroenterology

## 2021-02-05 MED ORDER — VIBERZI 75 MG PO TABS: 1.0000 | ORAL_TABLET | Freq: Two times a day (BID) | ORAL | 1 refills | Status: DC

## 2021-02-05 NOTE — Telephone Encounter (Signed)
Can you approve this medication soon? I had to pend it since it is a controlled medication.

## 2021-02-05 NOTE — Telephone Encounter (Signed)
VIBERZI 75 MG TABS  [facility] in [place]  Patient need stat as he is traveling.

## 2021-02-11 ENCOUNTER — Other Ambulatory Visit: Payer: Self-pay

## 2021-02-11 ENCOUNTER — Telehealth: Payer: Self-pay | Admitting: Gastroenterology

## 2021-02-11 DIAGNOSIS — K580 Irritable bowel syndrome with diarrhea: Secondary | ICD-10-CM

## 2021-02-11 MED ORDER — VIBERZI 75 MG PO TABS: 1.0000 | ORAL_TABLET | Freq: Two times a day (BID) | ORAL | 0 refills | Status: DC

## 2021-02-11 NOTE — Telephone Encounter (Signed)
Please call Katelyn at [facility] for verbal order for VIBERZI 75 [place]

## 2021-02-11 NOTE — Telephone Encounter (Signed)
Spoke with pharmacy and call was transferred to [facility] for verbal order approval.

## 2021-02-13 ENCOUNTER — Telehealth: Payer: Self-pay | Admitting: Gastroenterology

## 2021-02-13 NOTE — Telephone Encounter (Signed)
Patient called LVM that Rx was not at Pharmacy in [place], [place].  Please call to advise

## 2021-02-13 NOTE — Telephone Encounter (Signed)
Returned patients call. Informed pt Rx has been sent to [facility].

## 2021-02-13 NOTE — Telephone Encounter (Signed)
I left a message that I am returning his phone call.  Inform that I have personally signed physically as well as electronically his medications a few times at least for the last few days.  Explained that if he still  had difficulty getting the medications to give me a call back

## 2021-02-13 NOTE — Telephone Encounter (Signed)
Patient called LVM stating that his RX was sent to [facility] in [place] yesterday.  Patient states he needs RX sent to [facility] in [place], [place]. [facility] in [place] phone number is [phone] Fax number [phone]  Patient asks for call back please

## 2021-02-13 NOTE — Telephone Encounter (Signed)
Please call patient

## 2021-02-14 ENCOUNTER — Other Ambulatory Visit: Payer: Self-pay

## 2021-02-14 ENCOUNTER — Telehealth: Payer: Self-pay

## 2021-02-14 DIAGNOSIS — K580 Irritable bowel syndrome with diarrhea: Secondary | ICD-10-CM

## 2021-02-14 MED ORDER — VIBERZI 75 MG PO TABS: 1.0000 | ORAL_TABLET | Freq: Two times a day (BID) | ORAL | 3 refills | Status: DC

## 2021-02-14 NOTE — Telephone Encounter (Signed)
Returned patients call. He informed the pharmacy does not have the dosage prescribed by Dr. Vicente Males. He plans to find another pharmacy later today. Will call back.

## 2021-02-14 NOTE — Telephone Encounter (Signed)
Spoke with Brian Farmer , I believe the issue has been resolved

## 2021-02-14 NOTE — Telephone Encounter (Signed)
Rx has been faxed to the pharmacy in [place] per patients request.

## 2021-02-18 ENCOUNTER — Telehealth: Payer: Self-pay

## 2021-02-18 NOTE — Telephone Encounter (Signed)
Patient left a voicemail this afternoon. Says he hasn't received medication. Patient says he's currently in [place] until Wednesday. Please call patient.

## 2021-02-18 NOTE — Telephone Encounter (Signed)
Called and informed patient Rx had been faxed on 4/14 and 4/18 both days the faxed number he provided the status NO Answer. LVM asking him to provide another number.

## 2021-03-14 ENCOUNTER — Telehealth: Payer: Self-pay

## 2021-03-14 NOTE — Telephone Encounter (Signed)
Called patient to inform him of Dr. Georgeann Oppenheim recommendations. Explained to patient a detailed my chart message will be sent with Anna's comments/recommendations.

## 2021-03-14 NOTE — Telephone Encounter (Signed)
Patient is in a different state. Can't travel home because he's had severe diarhea. Needs relief wants to know what Dr. Vicente Males suggest that he do?

## 2021-10-01 ENCOUNTER — Telehealth: Payer: Self-pay | Admitting: Gastroenterology

## 2021-10-01 NOTE — Telephone Encounter (Signed)
Inbound call from pt requesting a call back stating he needs a refill on Viberzi. Pt is completely out of his medication. Please advise. Thank you.

## 2021-10-02 ENCOUNTER — Other Ambulatory Visit: Payer: Self-pay

## 2021-10-02 DIAGNOSIS — K580 Irritable bowel syndrome with diarrhea: Secondary | ICD-10-CM

## 2021-10-02 MED ORDER — VIBERZI 75 MG PO TABS: 1.0000 | ORAL_TABLET | Freq: Two times a day (BID) | ORAL | 3 refills | Status: DC

## 2021-10-02 NOTE — Telephone Encounter (Signed)
Refill was sent to his pharmacy.

## 2021-10-12 ENCOUNTER — Emergency Department
Admission: EM | Admit: 2021-10-12 | Discharge: 2021-10-12 | Disposition: A | Discharge status: Home / Self Care | Payer: Medicare Other | Attending: Emergency Medicine | Admitting: Emergency Medicine

## 2021-10-12 ENCOUNTER — Other Ambulatory Visit: Payer: Self-pay

## 2021-10-12 DIAGNOSIS — R197 Diarrhea, unspecified: Secondary | ICD-10-CM | POA: Insufficient documentation

## 2021-10-12 DIAGNOSIS — R1030 Lower abdominal pain, unspecified: Secondary | ICD-10-CM | POA: Diagnosis present

## 2021-10-12 DIAGNOSIS — Z5321 Procedure and treatment not carried out due to patient leaving prior to being seen by health care provider: Secondary | ICD-10-CM | POA: Insufficient documentation

## 2021-10-12 DIAGNOSIS — R110 Nausea: Secondary | ICD-10-CM | POA: Diagnosis not present

## 2021-10-12 LAB — COMPREHENSIVE METABOLIC PANEL
ALT: 33 U/L (ref 0–44)
AST: 21 U/L (ref 15–41)
Albumin: 4.3 g/dL (ref 3.5–5.0)
Alkaline Phosphatase: 55 U/L (ref 38–126)
Anion gap: 8 (ref 5–15)
BUN: 17 mg/dL (ref 8–23)
CO2: 25 mmol/L (ref 22–32)
Calcium: 8.8 mg/dL — ABNORMAL LOW (ref 8.9–10.3)
Chloride: 108 mmol/L (ref 98–111)
Creatinine, Ser: 1.01 mg/dL (ref 0.61–1.24)
GFR, Estimated: >60 mL/min (ref >60)
Glucose, Bld: 149 mg/dL — ABNORMAL HIGH (ref 70–99)
Potassium: 3.5 mmol/L (ref 3.5–5.1)
Sodium: 141 mmol/L (ref 135–145)
Total Bilirubin: 1.9 mg/dL — ABNORMAL HIGH (ref 0.3–1.2)
Total Protein: 6.9 g/dL (ref 6.5–8.1)

## 2021-10-12 LAB — CBC
HCT: 35.4 % — ABNORMAL LOW (ref 39.0–52.0)
Hemoglobin: 11.5 g/dL — ABNORMAL LOW (ref 13.0–17.0)
MCH: 30.5 pg (ref 26.0–34.0)
MCHC: 32.5 g/dL (ref 30.0–36.0)
MCV: 93.9 fL (ref 80.0–100.0)
Platelets: 292 10*3/uL (ref 150–400)
RBC: 3.77 MIL/uL — ABNORMAL LOW (ref 4.22–5.81)
RDW: 12.9 % (ref 11.5–15.5)
WBC: 6.7 10*3/uL (ref 4.0–10.5)
nRBC: 0 % (ref 0.0–0.2)

## 2021-10-12 LAB — LIPASE, BLOOD: Lipase: 26 U/L (ref 11–51)

## 2021-10-12 MED ORDER — ONDANSETRON 4 MG PO TBDP
4.0000 mg | ORAL_TABLET | Freq: Once | ORAL | Status: AC | PRN
  Administered 2021-10-12: 4 mg via ORAL
  Filled 2021-10-12: qty 1

## 2021-10-12 NOTE — ED Triage Notes (Signed)
Pt states he has been having severe lower abd pain with nausea around 2:30 this AM- pt denies any vomiting- pt ate some chinese yesterday that gave him diarrhea so he took 2 imodium- pt denies any urinary symptoms- pt then took x-lax around 0500

## 2021-10-12 NOTE — ED Notes (Signed)
Pt reports is leaving to go home, will follow up as needed.

## 2021-11-20 ENCOUNTER — Other Ambulatory Visit: Payer: Self-pay

## 2021-11-20 DIAGNOSIS — K580 Irritable bowel syndrome with diarrhea: Secondary | ICD-10-CM

## 2021-11-20 MED ORDER — VIBERZI 75 MG PO TABS: 1.0000 | ORAL_TABLET | Freq: Two times a day (BID) | ORAL | 3 refills | Status: DC

## 2021-11-21 ENCOUNTER — Encounter: Payer: Self-pay | Admitting: Gastroenterology

## 2021-11-21 ENCOUNTER — Ambulatory Visit (INDEPENDENT_AMBULATORY_CARE_PROVIDER_SITE_OTHER): Payer: Medicare Other | Admitting: Gastroenterology

## 2021-11-21 ENCOUNTER — Other Ambulatory Visit: Payer: Self-pay

## 2021-11-21 VITALS — BP 134/76 | HR 85 | Temp 98.1°F | Wt 175.6 lb

## 2021-11-21 DIAGNOSIS — K580 Irritable bowel syndrome with diarrhea: Secondary | ICD-10-CM

## 2021-11-21 NOTE — Progress Notes (Signed)
Jonathon Bellows MD, MRCP(U.K) [address]  [place]  [place], [place] [address]  Main: [phone]  Fax: [phone]   Primary Care Physician: [name], MD  Primary Gastroenterologist:  Dr. Jonathon Bellows   Chief Complaint  Patient presents with   IBS-D    HPI: Brian Farmer is a 65 y.o. male   Summary of history : He is being followed for abdominal pain and diarrhea.     He was initially referred and seen on 06/30/2018 by Dr. Ellison Hughs for abdominal pain.  He has a history of type 2 diabetes, GERD.  Last colonoscopy in May 2018 by Dr. Vira Agar had a rectal polyp that was excised.  He did have LA grade a esophagitis seen on his upper endoscopy.   H. pylori stool antigen negative.  His gallbladder is intact.  Genetic testing completed and he has has no syndromes .    At his initial visit he was on long-term NSAIDs, consumed a lot of artificial sugars which I felt contributed to his diarrhea bloating and abdominal pain which she subsequently stopped and had improvement of his symptoms.  I also felt that his abdominal pain was secondary to NSAID use .Says while he took Xifaxan had no diarrhea and when he stopped the antibiotics had diarrhea. Took 14 days of xifaxan. Had to take 2 imodiums - since has not had any diarrhea.    Bentyl did not work .  Low FODMAP diet has helped him in the past.  Week has made his bowel movements watery.  Tested negative for celiac disease.  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  Similar to foods with high fructose corn syrup.  Testing for food allergies was negative.     07/02/18 : GI stool PCR, C diff PCR,fecal calprotectin- negative  cvc   Interval history 11/30/2019-11/21/2021  He has been on Viberzi for over 2 years.  Been doing well.  Would like to consider reducing the dosage from twice a day to once a day.   Current Outpatient Medications  Medication Sig Dispense Refill   amLODipine (NORVASC) 5 MG tablet Take 5 mg by  mouth daily.     B-D ULTRAFINE III SHORT PEN 31G X 8 MM MISC 4 (four) times daily. as directed     chlorhexidine (PERIDEX) 0.12 % solution 15 mLs 2 (two) times daily.     cyanocobalamin (,VITAMIN B-12,) 1000 MCG/ML injection Inject 1,000 mcg into the muscle.     doxycycline (VIBRAMYCIN) 100 MG capsule Take 1 capsule (100 mg total) by mouth 2 (two) times daily. 10 capsule 0   dutasteride (AVODART) 0.5 MG capsule Take 0.5 mg by mouth daily.     Eluxadoline (VIBERZI) 75 MG TABS Take 1 tablet by mouth 2 (two) times daily. 180 tablet 3   erythromycin ophthalmic ointment Place a 1/2 inch ribbon of ointment into the lower eyelid 4 times daily for 7 days 3.5 g 0   fluticasone (FLONASE) 50 MCG/ACT nasal spray Place 1 spray into the nose daily.     insulin aspart (NOVOLOG) 100 UNIT/ML FlexPen Inject upto 70u per day     Insulin Disposable Pump (OMNIPOD 5 G6 POD, GEN 5,) MISC Inject into the skin.     Insulin Glargine 300 UNIT/ML SOPN Inject 50 Units into the skin 3 (three) times daily before meals.     Insulin Pen Needle (FIFTY50 PEN NEEDLES) 31G X 8 MM MISC 4 (four) times daily     lisinopril (PRINIVIL,ZESTRIL)  40 MG tablet Take 40 mg by mouth daily.     loratadine (CLARITIN) 10 MG tablet Take 1 tablet by mouth 1 day or 1 dose.     ONETOUCH DELICA LANCETS 50I MISC by Does not apply route.     pantoprazole (PROTONIX) 40 MG tablet Take 40 mg by mouth daily.     rifaximin (XIFAXAN) 200 MG tablet Take 200 mg by mouth 3 (three) times daily.     rosuvastatin (CRESTOR) 40 MG tablet   3   tadalafil (CIALIS) 5 MG tablet Take 1 tablet by mouth daily.     tamsulosin (FLOMAX) 0.4 MG CAPS capsule Take by mouth.     No current facility-administered medications for this visit.    Allergies as of 11/21/2021 - Review Complete 11/21/2021  Allergen Reaction Noted   Hydrocodone-acetaminophen Other (See Comments) and Nausea Only 01/19/2015   Morphine and related Nausea And Vomiting and Nausea Only 02/10/2021    Naproxen Nausea Only 07/20/2016   Vicodin [hydrocodone-acetaminophen] Nausea Only 08/01/2015    ROS:  General: Negative for anorexia, weight loss, fever, chills, fatigue, weakness. ENT: Negative for hoarseness, difficulty swallowing , nasal congestion. CV: Negative for chest pain, angina, palpitations, dyspnea on exertion, peripheral edema.  Respiratory: Negative for dyspnea at rest, dyspnea on exertion, cough, sputum, wheezing.  GI: See history of present illness. GU:  Negative for dysuria, hematuria, urinary incontinence, urinary frequency, nocturnal urination.  Endo: Negative for unusual weight change.    Physical Examination:   BP 134/76    Pulse 85    Temp 98.1 F (36.7 C) (Oral)    Wt 175 lb 9.6 oz (79.7 kg)    BMI 25.93 kg/m   General: Well-nourished, well-developed in no acute distress.  Eyes: No icterus. Conjunctivae pink. Neuro: Alert and oriented x 3.  Grossly intact. Skin: Warm and dry, no jaundice.   Psych: Alert and cooperative, normal mood and affect.   Imaging Studies: No results found.  Assessment and Plan:   Brian Farmer is a 66 y.o. y/o male  here to follow up for possible  IBS-D which improved significantly after a course of Xifaxan, subsequently symptoms recurred and did not respond to Xifaxan.  Checked for food allergies which are negative.  No evidence of celiac disease.  He does have a gallbladder.  He has been on Viberzi 75 mg twice a day for over 2 years has been doing well.  Would like to consider dropping the dose to once a day and would like to discuss that.  I explained to him I always believe in the least dose of medication that is required and absolutely no issues with reducing the dose to once a day to try. Dr Jonathon Bellows  MD,MRCP [facility]) Follow up in as needed

## 2022-01-13 ENCOUNTER — Encounter: Payer: Self-pay | Admitting: Gastroenterology

## 2022-02-11 ENCOUNTER — Encounter: Payer: Self-pay | Admitting: Gastroenterology

## 2022-03-03 ENCOUNTER — Other Ambulatory Visit: Payer: Self-pay

## 2022-03-03 DIAGNOSIS — K580 Irritable bowel syndrome with diarrhea: Secondary | ICD-10-CM

## 2022-03-03 MED ORDER — VIBERZI 75 MG PO TABS: 1.0000 | ORAL_TABLET | Freq: Two times a day (BID) | ORAL | 3 refills | Status: DC

## 2022-03-10 ENCOUNTER — Other Ambulatory Visit: Payer: Self-pay | Admitting: Gastroenterology

## 2022-03-24 NOTE — Telephone Encounter (Signed)
Herb Grays do you know if we have been able to send it?

## 2022-03-26 ENCOUNTER — Other Ambulatory Visit: Payer: Self-pay

## 2022-03-26 MED ORDER — ELUXADOLINE 75 MG PO TABS: 1.0000 | ORAL_TABLET | Freq: Two times a day (BID) | ORAL | 5 refills | Status: DC

## 2022-03-26 NOTE — Telephone Encounter (Signed)
Am unable to sign it - asks me for a code on imprivata, not sure what I need to do

## 2022-03-27 ENCOUNTER — Other Ambulatory Visit: Payer: Self-pay | Admitting: Gastroenterology

## 2022-03-27 MED ORDER — VIBERZI 75 MG PO TABS: 75.0000 mg | ORAL_TABLET | Freq: Two times a day (BID) | ORAL | 5 refills | Status: DC

## 2022-04-30 ENCOUNTER — Other Ambulatory Visit: Payer: Self-pay

## 2022-04-30 NOTE — Telephone Encounter (Signed)
Dr. Vicente Males, please let Dr. Allen Norris know that you need this prescription to be sent by Dr. Allen Norris. Thank you.

## 2022-04-30 NOTE — Telephone Encounter (Signed)
Didn't we just do it 2 weeks back ?

## 2022-05-01 MED ORDER — ELUXADOLINE 75 MG PO TABS: 1.0000 | ORAL_TABLET | Freq: Two times a day (BID) | ORAL | 5 refills | Status: DC

## 2022-05-01 MED ORDER — VIBERZI 75 MG PO TABS: 75.0000 mg | ORAL_TABLET | Freq: Two times a day (BID) | ORAL | 5 refills | Status: DC

## 2022-05-08 ENCOUNTER — Other Ambulatory Visit: Payer: Self-pay

## 2022-05-08 ENCOUNTER — Telehealth: Payer: Self-pay | Admitting: Gastroenterology

## 2022-05-08 MED ORDER — ELUXADOLINE 75 MG PO TABS: 1.0000 | ORAL_TABLET | Freq: Two times a day (BID) | ORAL | 5 refills | Status: DC

## 2022-05-08 MED ORDER — VIBERZI 75 MG PO TABS: 75.0000 mg | ORAL_TABLET | Freq: Two times a day (BID) | ORAL | 5 refills | Status: DC

## 2022-05-08 NOTE — Telephone Encounter (Signed)
Patient called stating that he is no longer in [place]. He is back home for a couple of days. His pharmacy is [facility] in [place], [place]. Pharmacy is in his chart. Thank you. Thank you Dr. Allen Norris for helping Dr. Vicente Males.

## 2022-05-08 NOTE — Telephone Encounter (Signed)
Called patient to let him know that I added [facility] to his pharmacy location and that I deleted the [place] location. I told him that hopefully Dr. Allen Norris would do Dr. Vicente Males the favor soon so he could get his medication. Patient stated that he had enough for this weekend.

## 2022-05-08 NOTE — Telephone Encounter (Signed)
Patient is requesting that someone call his pharmacy because his prescription has not been sent in. Pharmacy is [facility] in [place].

## 2022-05-09 NOTE — Telephone Encounter (Signed)
Dr. Allen Norris was able to send in the prescription for Dr. Vicente Males. I then called patient to let him know and if he had any questions to please give [place] a call. Patient understood and had no further questions.

## 2022-08-27 ENCOUNTER — Other Ambulatory Visit: Payer: Self-pay | Admitting: Family Medicine

## 2022-08-27 DIAGNOSIS — R17 Unspecified jaundice: Secondary | ICD-10-CM

## 2022-09-04 ENCOUNTER — Ambulatory Visit
Admission: RE | Admit: 2022-09-04 | Discharge: 2022-09-04 | Disposition: A | Discharge status: Home / Self Care | Payer: Medicare Other | Source: Ambulatory Visit | Attending: Family Medicine | Admitting: Family Medicine

## 2022-09-04 DIAGNOSIS — R17 Unspecified jaundice: Secondary | ICD-10-CM | POA: Diagnosis present

## 2023-01-29 ENCOUNTER — Other Ambulatory Visit: Payer: Self-pay | Admitting: Gastroenterology

## 2023-01-29 ENCOUNTER — Other Ambulatory Visit: Payer: Self-pay

## 2023-01-29 NOTE — Telephone Encounter (Signed)
Dr. Allen Norris, this is Dr. Georgeann Oppenheim patient but he does not know how to sign off on it. He always asks you to sign it off for him. If you have any questions, please contact Dr. Vicente Males. Thank you.

## 2023-02-27 ENCOUNTER — Other Ambulatory Visit: Payer: Self-pay | Admitting: Gastroenterology

## 2023-08-19 ENCOUNTER — Other Ambulatory Visit: Payer: Self-pay | Admitting: Gastroenterology

## 2023-08-26 ENCOUNTER — Other Ambulatory Visit: Payer: Self-pay | Admitting: Gastroenterology

## 2023-08-26 ENCOUNTER — Telehealth: Payer: Self-pay | Admitting: Gastroenterology

## 2023-08-26 NOTE — Telephone Encounter (Signed)
Patient called left a voicemail he want to schedule appointment. I called him back nobody answer I left a message to call us back.

## 2023-10-23 ENCOUNTER — Other Ambulatory Visit: Payer: Self-pay | Admitting: Physical Medicine and Rehabilitation

## 2023-10-23 DIAGNOSIS — M5412 Radiculopathy, cervical region: Secondary | ICD-10-CM

## 2023-11-11 ENCOUNTER — Ambulatory Visit: Payer: Medicare Other

## 2023-11-11 DIAGNOSIS — R197 Diarrhea, unspecified: Secondary | ICD-10-CM | POA: Diagnosis not present

## 2023-11-11 DIAGNOSIS — K640 First degree hemorrhoids: Secondary | ICD-10-CM | POA: Diagnosis not present

## 2023-11-11 DIAGNOSIS — Z1211 Encounter for screening for malignant neoplasm of colon: Secondary | ICD-10-CM | POA: Diagnosis present

## 2023-11-13 ENCOUNTER — Other Ambulatory Visit: Payer: Self-pay | Admitting: Gastroenterology

## 2023-11-20 ENCOUNTER — Ambulatory Visit
Admission: RE | Admit: 2023-11-20 | Discharge: 2023-11-20 | Disposition: A | Discharge status: Home / Self Care | Payer: Medicare Other | Source: Ambulatory Visit | Attending: Physical Medicine and Rehabilitation | Admitting: Physical Medicine and Rehabilitation

## 2023-11-20 DIAGNOSIS — M5412 Radiculopathy, cervical region: Secondary | ICD-10-CM

## 2024-04-15 NOTE — Progress Notes (Signed)
 Referring Physician:  Avanell Katz, MD [address] [place],  [place] [address]  Primary Physician:  Jeffie Cheryl BRAVO, MD  History of Present Illness: 04/19/2024 [name] is here today with a chief complaint of pain in his neck extending into his left trapezius muscle that has been ongoing for several months.  He has had it for years, but it has been worse over the past 6 months.  His pain can be as bad as 10 out of 10.  Nothing really makes it worse or makes it better.  He does have increased pain with rotating to the left and bending his head to the left.  He does not have any pain below his shoulder.  Bowel/Bladder Dysfunction: none  Conservative measures:  Physical therapy: has not participated in PT  Multimodal medical therapy including regular antiinflammatories: Advil, Flexeril, Gabapentin, Norco, Tramadol, celebrex Injections: 03/03/2024: RFA to the left C3-4 and C4-5 facet joints (minimal relief) 02/12/2024: MBB to the left C3-4 and C4-5 facet joints (8/10 to 0/10) 01/29/2024: MBB to the left C3-4 and C4-5 facet joints (8/10 to 1/10) 12/29/2023: Left C3-4 transforaminal ESI (less effective) 09/16/2023: Left C3-4 transforaminal ESI (50% relief, less effective) 05/11/2023: Left C3-4 transforaminal ESI (over 80% relief) 11/20/2022: Left L5-S1 transforaminal ESI (over 80% relief, Celestone 9 mg) 12/11/2021: Right L5-S1 and right S1 transforaminal ESI (Celestone 9 mg) 10/23/2021: Right L5-S1 and right S1 transforaminal ESI (good relief x 2 weeks, dexamethasone 10 mg) 04/24/2020: Right L5-S1 transforaminal ESI (good relief) 03/16/2020: Right L5-S1 and right S1 transforaminal ESI 12/30/2019: Right L4 trigger point injection (10 days of good relief) 03/24/2019: Left C3-4 transforaminal ESI (good relief) 02/08/2019: Right C3-4 transforaminal ESI (good relief) 02/11/2017: Left C3-4 transforaminal ESI (good relief) 03/14/2016: Left C3-4 transforaminal ESI (good  relief) 09/07/2015: Left C3-4 transforaminal ESI (60% relief) 07/13/2015: Left C4-5 transforaminal ESI (minimal relief)   Past Surgery: none  Brian Farmer has no symptoms of cervical myelopathy.  The symptoms are causing a significant impact on the patient's life.   I have utilized the care everywhere function in epic to review the outside records available from external health systems.  Review of Systems:  A 10 point review of systems is negative, except for the pertinent positives and negatives detailed in the HPI.  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    Past Surgical History: Past Surgical History:  Procedure Laterality Date   ANKLE SURGERY     COLONOSCOPY WITH PROPOFOL  N/A 08/02/2015   Procedure: COLONOSCOPY WITH PROPOFOL ;  Surgeon: Deward CINDERELLA Piedmont, MD;  Location: [facility] ENDOSCOPY;  Service: Gastroenterology;  Laterality: N/A;   COLONOSCOPY WITH PROPOFOL  N/A 03/04/2017   Procedure: COLONOSCOPY WITH PROPOFOL ;  Surgeon: Lamar ONEIDA Holmes, MD;  Location: [facility] ENDOSCOPY;  Service: Endoscopy;  Laterality: N/A;   ESOPHAGOGASTRODUODENOSCOPY (EGD) WITH PROPOFOL  N/A 03/04/2017   Procedure: ESOPHAGOGASTRODUODENOSCOPY (EGD) WITH PROPOFOL ;  Surgeon: Lamar ONEIDA Holmes, MD;  Location: [facility] ENDOSCOPY;  Service: Endoscopy;  Laterality: N/A;   SHOULDER SURGERY     TRIGGER FINGER RELEASE Bilateral     Allergies: Allergies as of 04/19/2024   (No Known Allergies)     Medications:  Current Outpatient Medications:    celecoxib (CELEBREX) 100 MG capsule, Take 1 capsule by mouth daily as needed., Disp: , Rfl:    amLODipine (NORVASC) 5 MG tablet, Take 5 mg by mouth daily., Disp: , Rfl:    B-D ULTRAFINE III SHORT PEN 31G X 8 MM MISC, 4 (four) times daily. as directed, Disp: , Rfl:    chlorhexidine (PERIDEX) 0.12 % solution, 15 mLs 2 (two) times daily., Disp: , Rfl:    cyanocobalamin (,VITAMIN B-12,) 1000 MCG/ML injection, Inject 1,000 mcg into the muscle., Disp: , Rfl:    doxycycline  (VIBRAMYCIN ) 100 MG capsule, Take 1 capsule (100 mg total) by mouth 2 (two) times daily., Disp: 10 capsule, Rfl: 0   dutasteride (AVODART) 0.5 MG capsule, Take 0.5 mg by mouth daily., Disp: , Rfl:    Eluxadoline  75 MG TABS, Take 1 tablet by mouth in the morning and at bedtime., Disp: 60 tablet, Rfl: 5   erythromycin  ophthalmic ointment, Place a 1/2 inch ribbon of ointment into the lower eyelid 4 times daily for 7 days, Disp: 3.5 g, Rfl: 0   fluticasone (FLONASE) 50 MCG/ACT nasal spray, Place 1 spray into the nose daily., Disp: , Rfl:    insulin aspart (NOVOLOG) 100 UNIT/ML FlexPen, Inject upto 70u per day, Disp: , Rfl:    Insulin Disposable Pump (OMNIPOD 5 G6 POD, GEN 5,) MISC, Inject into the skin., Disp: , Rfl:    Insulin Glargine 300 UNIT/ML SOPN, Inject 50 Units into the skin 3 (three) times daily before meals., Disp: , Rfl:    Insulin Pen Needle (FIFTY50 PEN NEEDLES) 31G X 8 MM MISC, 4 (four) times daily, Disp: , Rfl:    lisinopril (PRINIVIL,ZESTRIL) 40 MG tablet, Take 40 mg by mouth daily., Disp: , Rfl:    loratadine (CLARITIN) 10 MG tablet, Take 1 tablet by mouth 1 day or 1 dose., Disp: , Rfl:    ONETOUCH DELICA LANCETS 33G MISC, by Does not apply route., Disp: , Rfl:    pantoprazole (PROTONIX) 40 MG tablet, Take 40 mg by mouth daily., Disp: , Rfl:    rifaximin  (XIFAXAN ) 200 MG tablet, Take 200 mg by mouth 3 (three) times daily., Disp: , Rfl:    rosuvastatin (CRESTOR) 40  MG tablet, , Disp: , Rfl: 3   tadalafil  (CIALIS ) 5 MG tablet, Take 1 tablet by mouth daily., Disp: , Rfl:    tamsulosin  (FLOMAX ) 0.4 MG CAPS capsule, Take by mouth., Disp: , Rfl:    VIBERZI  75 MG TABS, TAKE 1 TABLET BY MOUTH TWICE  DAILY, Disp: 60 tablet, Rfl: 5  Social History: Social History   Tobacco Use   Smoking status: Some Days    Types: Cigars   Smokeless tobacco: Never   Tobacco comments:    Quit smoking cigarettes in 2010. Smokes occasional cigar  Vaping Use   Vaping status: Never Used  Substance Use Topics   Alcohol use: Yes    Alcohol/week: 8.0 standard drinks of alcohol    Types: 7 Cans of beer, 1 Shots of liquor per week    Comment: occasional   Drug use: No    Family Medical History: Family History  Problem Relation Age of Onset   Hypertension Mother  Cancer Mother 16       bone marrow   Hypertension Brother    Leukemia Father 35   Leukemia Brother 42   Colon cancer Other 43   Prostate cancer Neg Hx    Kidney cancer Neg Hx    Kidney disease Neg Hx     Physical Examination: Vitals:   04/19/24 1105  BP: 126/70    General: Patient is in no apparent distress. Attention to examination is appropriate.  Neck:   Supple.  Full range of motion.  Respiratory: Patient is breathing without any difficulty.   NEUROLOGICAL:     Awake, alert, oriented to person, place, and time.  Speech is clear and fluent.   Cranial Nerves: Pupils equal round and reactive to light.  Facial tone is symmetric.  Facial sensation is symmetric. Shoulder shrug is symmetric. Tongue protrusion is midline.  There is no pronator drift.  Strength: Side Biceps Triceps Deltoid Interossei Grip Wrist Ext. Wrist Flex.  R 5 5 5 5 5 5 5   L 5 5 5 5 5 5 5    Side Iliopsoas Quads Hamstring PF DF EHL  R 5 5 5 5 5 5   L 5 5 5 5 5 5    Reflexes are 1+ and symmetric at the biceps, triceps, brachioradialis, patella and achilles.   Hoffman's is absent.   Bilateral upper and lower extremity  sensation is intact to light touch.    No evidence of dysmetria noted.  Gait is normal.     Medical Decision Making  Imaging: MRI C spine 11/20/2023 IMPRESSION: 1. 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 2. Mild bilateral foraminal narrowing C4-5. 3. Moderate left foraminal narrowing C5-6.     Electronically Signed   By: Debby Prader M.D.   On: 11/29/2023 08:56  I have personally reviewed the images and agree with the above interpretation.  Assessment and Plan: [name] is a pleasant 68 y.o. 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  He has tried injections which helped for some time, but have stopped being helpful.  We discussed that he can continue ibuprofen up to 800 mg 3 times a day as needed.  Additionally, we will start him on physical therapy.  I will see him back in 2 months.  If he is not improved at that time, we will discuss C3-4 anterior cervical discectomy and fusion.  I spent a total of 30 minutes in this patient's care today. This time was spent reviewing pertinent records including imaging studies, obtaining and confirming history, performing a directed evaluation, formulating and discussing my recommendations, and documenting the visit within the medical record.      Thank you for involving me in the care of this patient.      Antavion Bartoszek K. Clois MD, [facility] Neurosurgery

## 2024-04-19 ENCOUNTER — Encounter: Payer: Self-pay | Admitting: Neurosurgery

## 2024-04-19 ENCOUNTER — Ambulatory Visit (INDEPENDENT_AMBULATORY_CARE_PROVIDER_SITE_OTHER): Admitting: Neurosurgery

## 2024-04-19 VITALS — BP 126/70 | Ht 68.0 in | Wt 172.4 lb

## 2024-04-19 DIAGNOSIS — M5412 Radiculopathy, cervical region: Secondary | ICD-10-CM | POA: Diagnosis not present

## 2024-05-31 ENCOUNTER — Other Ambulatory Visit: Payer: Self-pay | Admitting: Physical Medicine and Rehabilitation

## 2024-05-31 DIAGNOSIS — M5416 Radiculopathy, lumbar region: Secondary | ICD-10-CM

## 2024-05-31 DIAGNOSIS — M5126 Other intervertebral disc displacement, lumbar region: Secondary | ICD-10-CM

## 2024-06-02 ENCOUNTER — Other Ambulatory Visit

## 2024-06-03 ENCOUNTER — Other Ambulatory Visit

## 2024-06-04 ENCOUNTER — Ambulatory Visit
Admission: RE | Admit: 2024-06-04 | Discharge: 2024-06-04 | Disposition: A | Discharge status: Home / Self Care | Source: Ambulatory Visit | Attending: Physical Medicine and Rehabilitation | Admitting: Physical Medicine and Rehabilitation

## 2024-06-04 DIAGNOSIS — M5126 Other intervertebral disc displacement, lumbar region: Secondary | ICD-10-CM

## 2024-06-04 DIAGNOSIS — M5416 Radiculopathy, lumbar region: Secondary | ICD-10-CM

## 2024-06-14 ENCOUNTER — Encounter: Payer: Self-pay | Admitting: Neurosurgery

## 2024-06-14 ENCOUNTER — Other Ambulatory Visit: Payer: Self-pay

## 2024-06-14 ENCOUNTER — Ambulatory Visit: Admitting: Neurosurgery

## 2024-06-14 VITALS — BP 138/76 | Ht 68.0 in | Wt 172.0 lb

## 2024-06-14 DIAGNOSIS — M5412 Radiculopathy, cervical region: Secondary | ICD-10-CM | POA: Diagnosis not present

## 2024-06-14 DIAGNOSIS — M4312 Spondylolisthesis, cervical region: Secondary | ICD-10-CM

## 2024-06-14 DIAGNOSIS — Z01818 Encounter for other preprocedural examination: Secondary | ICD-10-CM

## 2024-06-14 NOTE — H&P (View-Only) (Signed)
 Referring Physician:  Jeffie Cheryl BRAVO, MD [address] [place],  [place] [address]  Primary Physician:  Jeffie Cheryl BRAVO, MD  History of Present Illness: 06/14/2024 [name] has been doing physical therapy without improvement.  He continues to have severe neck and left trapezius pain.   04/19/2024 [name] is here today with a chief complaint of pain in his neck extending into his left trapezius muscle that has been ongoing for several months.  He has had it for years, but it has been worse over the past 6 months.  His pain can be as bad as 10 out of 10.  Nothing really makes it worse or makes it better.  He does have increased pain with rotating to the left and bending his head to the left.  He does not have any pain below his shoulder.  Bowel/Bladder Dysfunction: none  Conservative measures:  Physical therapy: has not participated in PT  Multimodal medical therapy including regular antiinflammatories: Advil, Flexeril, Gabapentin, Norco, Tramadol, celebrex Injections: 03/03/2024: RFA to the left C3-4 and C4-5 facet joints (minimal relief) 02/12/2024: MBB to the left C3-4 and C4-5 facet joints (8/10 to 0/10) 01/29/2024: MBB to the left C3-4 and C4-5 facet joints (8/10 to 1/10) 12/29/2023: Left C3-4 transforaminal ESI (less effective) 09/16/2023: Left C3-4 transforaminal ESI (50% relief, less effective) 05/11/2023: Left C3-4 transforaminal ESI (over 80% relief) 11/20/2022: Left L5-S1 transforaminal ESI (over 80% relief, Celestone 9 mg) 12/11/2021: Right L5-S1 and right S1 transforaminal ESI (Celestone 9 mg) 10/23/2021: Right L5-S1 and right S1 transforaminal ESI (good relief x 2 weeks, dexamethasone 10 mg) 04/24/2020: Right L5-S1 transforaminal ESI (good relief) 03/16/2020: Right L5-S1 and right S1 transforaminal ESI 12/30/2019: Right L4 trigger point injection (10 days of good relief) 03/24/2019: Left C3-4 transforaminal ESI (good relief) 02/08/2019: Right C3-4  transforaminal ESI (good relief) 02/11/2017: Left C3-4 transforaminal ESI (good relief) 03/14/2016: Left C3-4 transforaminal ESI (good relief) 09/07/2015: Left C3-4 transforaminal ESI (60% relief) 07/13/2015: Left C4-5 transforaminal ESI (minimal relief)   Past Surgery: none  Brian Farmer has no symptoms of cervical myelopathy.  The symptoms are causing a significant impact on the patient's life.   I have utilized the care everywhere function in epic to review the outside records available from external health systems.  Review of Systems:  A 10 point review of systems is negative, except for the pertinent positives and negatives detailed in the HPI.  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    Past Surgical History: Past Surgical History:  Procedure Laterality Date   ANKLE SURGERY     COLONOSCOPY WITH PROPOFOL  N/A 08/02/2015   Procedure: COLONOSCOPY WITH PROPOFOL ;  Surgeon: Deward CINDERELLA Piedmont, MD;  Location: [facility] ENDOSCOPY;  Service: Gastroenterology;  Laterality: N/A;   COLONOSCOPY WITH PROPOFOL  N/A 03/04/2017   Procedure: COLONOSCOPY WITH PROPOFOL ;  Surgeon: Lamar ONEIDA Holmes, MD;  Location:  [facility] ENDOSCOPY;  Service: Endoscopy;  Laterality: N/A;   ESOPHAGOGASTRODUODENOSCOPY (EGD) WITH PROPOFOL  N/A 03/04/2017   Procedure: ESOPHAGOGASTRODUODENOSCOPY (EGD) WITH PROPOFOL ;  Surgeon: Lamar ONEIDA Holmes, MD;  Location: [facility] ENDOSCOPY;  Service: Endoscopy;  Laterality: N/A;    SHOULDER SURGERY     TRIGGER FINGER RELEASE Bilateral     Allergies: Allergies as of 06/14/2024   (No Known Allergies)    Medications:  Current Outpatient Medications:    amLODipine (NORVASC) 5 MG tablet, Take 5 mg by mouth daily., Disp: , Rfl:    B-D ULTRAFINE III SHORT PEN 31G X 8 MM MISC, 4 (four) times daily. as directed, Disp: , Rfl:    celecoxib (CELEBREX) 100 MG capsule, Take 1 capsule by mouth daily as needed., Disp: , Rfl:    cyanocobalamin (,VITAMIN B-12,) 1000 MCG/ML injection, Inject 1,000 mcg into the muscle., Disp: , Rfl:    dutasteride (AVODART) 0.5 MG capsule, Take 0.5 mg by mouth daily., Disp: , Rfl:    Eluxadoline  75 MG TABS, Take 1 tablet by mouth in the morning and at bedtime., Disp: 60 tablet, Rfl: 5   fluticasone (FLONASE) 50 MCG/ACT nasal spray, Place 1 spray into the nose daily., Disp: , Rfl:    insulin aspart (NOVOLOG) 100 UNIT/ML FlexPen, Inject upto 70u per day, Disp: , Rfl:    Insulin Disposable Pump (OMNIPOD 5 G6 POD, GEN 5,) MISC, Inject into the skin., Disp: , Rfl:    Insulin Glargine 300 UNIT/ML SOPN, Inject 50 Units into the skin 3 (three) times daily before meals., Disp: , Rfl:    Insulin Pen Needle (FIFTY50 PEN NEEDLES) 31G X 8 MM MISC, 4 (four) times daily, Disp: , Rfl:    lisinopril (PRINIVIL,ZESTRIL) 40 MG tablet, Take 40 mg by mouth daily., Disp: , Rfl:    loratadine (CLARITIN) 10 MG tablet, Take 1 tablet by mouth 1 day or 1 dose., Disp: , Rfl:    ONETOUCH DELICA LANCETS 33G MISC, by Does not apply route., Disp: , Rfl:    pantoprazole (PROTONIX) 40 MG tablet, Take 40 mg by mouth daily., Disp: , Rfl:    rifaximin  (XIFAXAN ) 200 MG tablet, Take 200 mg by mouth 3 (three) times daily., Disp: , Rfl:    rosuvastatin (CRESTOR) 40 MG tablet, , Disp: , Rfl: 3   tadalafil  (CIALIS ) 5 MG tablet, Take 1 tablet by mouth daily., Disp: , Rfl:    tamsulosin  (FLOMAX ) 0.4 MG CAPS capsule, Take by mouth., Disp: , Rfl:    VIBERZI  75 MG TABS, TAKE 1 TABLET BY MOUTH  TWICE  DAILY, Disp: 60 tablet, Rfl: 5  Social History: Social History   Tobacco Use   Smoking status: Some Days    Types: Cigars   Smokeless tobacco: Never   Tobacco comments:    Quit smoking cigarettes in 2010. Smokes occasional cigar  Vaping Use   Vaping status: Never Used  Substance Use Topics   Alcohol use: Yes    Alcohol/week: 8.0 standard drinks of alcohol    Types: 7 Cans of beer, 1 Shots of liquor per week    Comment: occasional   Drug use: No    Family Medical History: Family History  Problem Relation Age of Onset   Hypertension Mother    Cancer Mother 48       bone marrow   Hypertension Brother    Leukemia Father 25   Leukemia Brother 78   Colon cancer Other 43   Prostate cancer Neg Hx    Kidney cancer  Neg Hx    Kidney disease Neg Hx     Physical Examination: Vitals:   06/14/24 1118  BP: 138/76    General: Patient is in no apparent distress. Attention to examination is appropriate.  Neck:   Supple.  Full range of motion.  Respiratory: Patient is breathing without any difficulty.   NEUROLOGICAL:     Awake, alert, oriented to person, place, and time.  Speech is clear and fluent.   Cranial Nerves: Pupils equal round and reactive to light.  Facial tone is symmetric.  Facial sensation is symmetric. Shoulder shrug is symmetric. Tongue protrusion is midline.  There is no pronator drift.  Strength: Side Biceps Triceps Deltoid Interossei Grip Wrist Ext. Wrist Flex.  R 5 5 5 5 5 5 5   L 5 5 5 5 5 5 5    Side Iliopsoas Quads Hamstring PF DF EHL  R 5 5 5 5 5 5   L 5 5 5 5 5 5    Reflexes are 1+ and symmetric at the biceps, triceps, brachioradialis, patella and achilles.   Hoffman's is absent.   Bilateral upper and lower extremity sensation is intact to light touch.    No evidence of dysmetria noted.  Gait is normal.     Medical Decision Making  Imaging: MRI C spine 11/20/2023 IMPRESSION: 1. 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 2. Mild bilateral foraminal narrowing C4-5. 3. Moderate left foraminal narrowing C5-6.     Electronically Signed   By: Debby Prader M.D.   On: 11/29/2023 08:56  I have personally reviewed the images and agree with the above interpretation.  Assessment and Plan: Brian Farmer is a pleasant 68 y.o. male with left C4 radiculopathy.  He has anterolisthesis of C3 on C4 with compression of his left C4 nerve root.  He also has retrolisthesis at C4 and C5.  He has tried and failed conservative management.  No further conservative management is indicated.  Have recommended C3-4 anterior cervical discectomy and fusion.  We discussed the utility of including C4-5 or C5-6, but I ultimately recommended that he start with a C3-4 intervention.    I discussed the planned procedure at length with the patient, including the risks, benefits, alternatives, and indications. The risks discussed include but are not limited to bleeding, infection, need for reoperation, spinal fluid leak, stroke, vision loss, anesthetic complication, coma, paralysis, and even death. We also discussed the possibility of post-operative dysphagia, vocal cord paralysis, and the risk of adjacent segment disease in the future. I also described in detail that improvement was not guaranteed.  The patient expressed understanding of these risks, and asked that we proceed with surgery. I described the surgery in layman's terms, and gave ample opportunity for questions, which were answered to the best of my ability.  I spent a total of 30 minutes in this patient's care today. This time was spent reviewing pertinent records including imaging studies, obtaining and confirming history, performing a directed evaluation, formulating and discussing my recommendations, and documenting the visit within the medical record.      Thank you for  involving me in the care of this patient.      Sharon Stapel K. Clois MD, [facility] Neurosurgery

## 2024-06-14 NOTE — Patient Instructions (Signed)
 Please see below for information in regards to your upcoming surgery:   Planned surgery: C3-4 anterior cervical discectomy and fusion   Surgery date: 07/06/24 at [facility] (Medical Mall: [address] [place], [place] [address]) - you will find out your arrival time the business day before your surgery.   Pre-op appointment at [facility] Pre-admit Testing: you will receive a call with a date/time for this appointment. If you are scheduled for an in person appointment, Pre-admit Testing is located on the first floor of the [facility] building, [address], [address]. During this appointment, they will advise you which medications you can take the morning of surgery, and which medications you will need to hold for surgery. Labs (such as blood work, EKG) may be done at your pre-op appointment. You are not required to fast for these labs. Should you need to change your pre-op appointment, please call Pre-admit testing at [phone].      NSAIDS (Non-steroidal anti-inflammatory drugs): because you are having a fusion, please avoid taking any NSAIDS (examples: ibuprofen, motrin, aleve , naproxen , meloxicam, diclofenac) for 3 months after surgery. Celebrex is an exception and is OK to take, if prescribed. Tylenol is not an NSAID.    Common restrictions after spine surgery: No bending, lifting, or twisting ("BLT"). Avoid lifting objects heavier than 10 pounds for the first 6 weeks after surgery. Where possible, avoid household activities that involve lifting, bending, reaching, pushing, or pulling such as laundry, vacuuming, grocery shopping, and childcare. Try to arrange for help from friends and family for these activities while you heal. Do not drive while taking prescription pain medication. Weeks 6 through 12 after surgery: avoid lifting more than 25 pounds.    X-rays after surgery: Because you are having a fusion: for appointments after your 2 week  follow-up: please arrive at the [facility] outpatient imaging center ([address], [address], [place]) or [employer] one hour prior to your appointment for x-rays. This applies to every appointment after your 2 week follow-up. Failure to do so may result in your appointment being rescheduled. *We recently started construction to have x-ray in our office. This may be completed by the time you come in for your 6 week post-op appointment. Please check with us  closer to that time to see if you can have your x-rays at our office*    How to contact us :  If you have any questions/concerns before or after surgery, you can reach us  at [phone], or you can send a mychart message. We can be reached by phone or mychart 8am-4pm, Monday-Friday.  *Please note: Calls after 4pm are forwarded to a third party answering service. Mychart messages are not routinely monitored during evenings, weekends, and holidays. Please call our office to contact the answering service for urgent concerns during non-business hours.   If you have FMLA/disability paperwork, please drop it off or fax it to [phone]   Appointments/FMLA & disability paperwork: [employer] Registered Nurse/Surgery scheduler: Sumiya Mamaril, RN Certified Medical Assistants: Don, CMA, Elenor, CMA, & Damien, CMA Physician Assistants: Lyle Decamp, PA-C, Edsel Goods, PA-C & Glade Boys, PA-C Surgeons: Penne Sharps, MD & Reeves Daisy, MD   [facility] PREADMIT TESTING VISIT and SURGERY INFORMATION SHEET   Now that surgery has been scheduled you can anticipate several phone calls from [facility] services. A pharmacy technician will call you to verify your current list of medications taken at home.  The Pre-Service Center will call to verify your insurance information and to give you billing estimates and information.             The Preadmit Testing Office will be calling  to schedule a visit to obtain information for the anesthesia team and provide instructions on preparation for surgery.  What can you expect for the Preadmit Testing Visit: Appointments may be scheduled in-person or by telephone.  If a telephone visit is scheduled, you may be asked to come into the office to have lab tests or other studies performed.   This visit will not be completed any greater than 14 days prior to your surgery.  If your surgery has been scheduled for a future date, please do not be alarmed if we have not contacted you to schedule an appointment more than a month prior to the surgery date.    Please be prepared to provide the following information during this appointment:            -Personal medical history                                               -Medication and allergy  list            -Any history of problems with anesthesia              -Recent lab work or diagnostic studies            -Please notify us  of any needs we should be aware of to provide the best care possible           -You will be provided with instructions on how to prepare for your surgery.    On The Day of Surgery:  You must have a driver to take you home after surgery, you will be asked not to drive for 24 hours following surgery.  Taxi, Gisele and non-medical transport will not be acceptable means of transportation unless you have a responsible individual who will be traveling with you.  Visitors in the surgical area:   2 people will be able to visit you in your room once your preparation for surgery has been completed. During surgery, your visitors will be asked to wait in the Surgery Waiting Area.  It is not a requirement for them to stay, if they prefer to leave and come back.  Your visitor(s) will be given an update once the surgery has been completed.  No visitors are allowed in the initial recovery room to respect patient privacy and safety.  Once you are more awake and transfer to the  secondary recovery area, or are transferred to an inpatient room, visitors will again be able to see you.  To respect and protect your privacy: We will ask on the day of surgery who your driver will be and what the contact number for that individual will be. We will ask if it is okay to share information with this individual, or if there is an alternative individual that we, or the surgeon, should contact to provide updates and information. If family or friends come to the surgical information desk requesting information about you, who you have not listed with us , no information will be given.   It may be helpful to designate someone as the main contact who will be responsible for updating your other friends  and family.    PREADMIT TESTING OFFICE: [phone] SAME DAY SURGERY: [phone] We look forward to caring for you before and throughout the process of your surgery.

## 2024-06-14 NOTE — Progress Notes (Signed)
 Referring Physician:  Jeffie Cheryl BRAVO, MD [address] [place],  [place] [address]  Primary Physician:  Jeffie Cheryl BRAVO, MD  History of Present Illness: 06/14/2024 Brian Farmer has been doing physical therapy without improvement.  He continues to have severe neck and left trapezius pain.   04/19/2024 Brian Farmer is here today with a chief complaint of pain in his neck extending into his left trapezius muscle that has been ongoing for several months.  He has had it for years, but it has been worse over the past 6 months.  His pain can be as bad as 10 out of 10.  Nothing really makes it worse or makes it better.  He does have increased pain with rotating to the left and bending his head to the left.  He does not have any pain below his shoulder.  Bowel/Bladder Dysfunction: none  Conservative measures:  Physical therapy: has not participated in PT  Multimodal medical therapy including regular antiinflammatories: Advil, Flexeril, Gabapentin, Norco, Tramadol, celebrex Injections: 03/03/2024: RFA to the left C3-4 and C4-5 facet joints (minimal relief) 02/12/2024: MBB to the left C3-4 and C4-5 facet joints (8/10 to 0/10) 01/29/2024: MBB to the left C3-4 and C4-5 facet joints (8/10 to 1/10) 12/29/2023: Left C3-4 transforaminal ESI (less effective) 09/16/2023: Left C3-4 transforaminal ESI (50% relief, less effective) 05/11/2023: Left C3-4 transforaminal ESI (over 80% relief) 11/20/2022: Left L5-S1 transforaminal ESI (over 80% relief, Celestone 9 mg) 12/11/2021: Right L5-S1 and right S1 transforaminal ESI (Celestone 9 mg) 10/23/2021: Right L5-S1 and right S1 transforaminal ESI (good relief x 2 weeks, dexamethasone 10 mg) 04/24/2020: Right L5-S1 transforaminal ESI (good relief) 03/16/2020: Right L5-S1 and right S1 transforaminal ESI 12/30/2019: Right L4 trigger point injection (10 days of good relief) 03/24/2019: Left C3-4 transforaminal ESI (good relief) 02/08/2019: Right C3-4  transforaminal ESI (good relief) 02/11/2017: Left C3-4 transforaminal ESI (good relief) 03/14/2016: Left C3-4 transforaminal ESI (good relief) 09/07/2015: Left C3-4 transforaminal ESI (60% relief) 07/13/2015: Left C4-5 transforaminal ESI (minimal relief)   Past Surgery: none  Brian Farmer has no symptoms of cervical myelopathy.  The symptoms are causing a significant impact on the patient's life.   I have utilized the care everywhere function in epic to review the outside records available from external health systems.  Review of Systems:  A 10 point review of systems is negative, except for the pertinent positives and negatives detailed in the HPI.  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    Past Surgical History: Past Surgical History:  Procedure Laterality Date   ANKLE SURGERY     COLONOSCOPY WITH PROPOFOL  N/A 08/02/2015   Procedure: COLONOSCOPY WITH PROPOFOL ;  Surgeon: Deward CINDERELLA Piedmont, MD;  Location: [facility] ENDOSCOPY;  Service: Gastroenterology;  Laterality: N/A;   COLONOSCOPY WITH PROPOFOL  N/A 03/04/2017   Procedure: COLONOSCOPY WITH PROPOFOL ;  Surgeon: Lamar ONEIDA Holmes, MD;  Location:  [facility] ENDOSCOPY;  Service: Endoscopy;  Laterality: N/A;   ESOPHAGOGASTRODUODENOSCOPY (EGD) WITH PROPOFOL  N/A 03/04/2017   Procedure: ESOPHAGOGASTRODUODENOSCOPY (EGD) WITH PROPOFOL ;  Surgeon: Lamar ONEIDA Holmes, MD;  Location: [facility] ENDOSCOPY;  Service: Endoscopy;  Laterality: N/A;    SHOULDER SURGERY     TRIGGER FINGER RELEASE Bilateral     Allergies: Allergies as of 06/14/2024   (No Known Allergies)    Medications:  Current Outpatient Medications:    amLODipine (NORVASC) 5 MG tablet, Take 5 mg by mouth daily., Disp: , Rfl:    B-D ULTRAFINE III SHORT PEN 31G X 8 MM MISC, 4 (four) times daily. as directed, Disp: , Rfl:    celecoxib (CELEBREX) 100 MG capsule, Take 1 capsule by mouth daily as needed., Disp: , Rfl:    cyanocobalamin (,VITAMIN B-12,) 1000 MCG/ML injection, Inject 1,000 mcg into the muscle., Disp: , Rfl:    dutasteride (AVODART) 0.5 MG capsule, Take 0.5 mg by mouth daily., Disp: , Rfl:    Eluxadoline  75 MG TABS, Take 1 tablet by mouth in the morning and at bedtime., Disp: 60 tablet, Rfl: 5   fluticasone (FLONASE) 50 MCG/ACT nasal spray, Place 1 spray into the nose daily., Disp: , Rfl:    insulin aspart (NOVOLOG) 100 UNIT/ML FlexPen, Inject upto 70u per day, Disp: , Rfl:    Insulin Disposable Pump (OMNIPOD 5 G6 POD, GEN 5,) MISC, Inject into the skin., Disp: , Rfl:    Insulin Glargine 300 UNIT/ML SOPN, Inject 50 Units into the skin 3 (three) times daily before meals., Disp: , Rfl:    Insulin Pen Needle (FIFTY50 PEN NEEDLES) 31G X 8 MM MISC, 4 (four) times daily, Disp: , Rfl:    lisinopril (PRINIVIL,ZESTRIL) 40 MG tablet, Take 40 mg by mouth daily., Disp: , Rfl:    loratadine (CLARITIN) 10 MG tablet, Take 1 tablet by mouth 1 day or 1 dose., Disp: , Rfl:    ONETOUCH DELICA LANCETS 33G MISC, by Does not apply route., Disp: , Rfl:    pantoprazole (PROTONIX) 40 MG tablet, Take 40 mg by mouth daily., Disp: , Rfl:    rifaximin  (XIFAXAN ) 200 MG tablet, Take 200 mg by mouth 3 (three) times daily., Disp: , Rfl:    rosuvastatin (CRESTOR) 40 MG tablet, , Disp: , Rfl: 3   tadalafil  (CIALIS ) 5 MG tablet, Take 1 tablet by mouth daily., Disp: , Rfl:    tamsulosin  (FLOMAX ) 0.4 MG CAPS capsule, Take by mouth., Disp: , Rfl:    VIBERZI  75 MG TABS, TAKE 1 TABLET BY MOUTH  TWICE  DAILY, Disp: 60 tablet, Rfl: 5  Social History: Social History   Tobacco Use   Smoking status: Some Days    Types: Cigars   Smokeless tobacco: Never   Tobacco comments:    Quit smoking cigarettes in 2010. Smokes occasional cigar  Vaping Use   Vaping status: Never Used  Substance Use Topics   Alcohol use: Yes    Alcohol/week: 8.0 standard drinks of alcohol    Types: 7 Cans of beer, 1 Shots of liquor per week    Comment: occasional   Drug use: No    Family Medical History: Family History  Problem Relation Age of Onset   Hypertension Mother    Cancer Mother 48       bone marrow   Hypertension Brother    Leukemia Father 25   Leukemia Brother 78   Colon cancer Other 43   Prostate cancer Neg Hx    Kidney cancer  Neg Hx    Kidney disease Neg Hx     Physical Examination: Vitals:   06/14/24 1118  BP: 138/76    General: Patient is in no apparent distress. Attention to examination is appropriate.  Neck:   Supple.  Full range of motion.  Respiratory: Patient is breathing without any difficulty.   NEUROLOGICAL:     Awake, alert, oriented to person, place, and time.  Speech is clear and fluent.   Cranial Nerves: Pupils equal round and reactive to light.  Facial tone is symmetric.  Facial sensation is symmetric. Shoulder shrug is symmetric. Tongue protrusion is midline.  There is no pronator drift.  Strength: Side Biceps Triceps Deltoid Interossei Grip Wrist Ext. Wrist Flex.  R 5 5 5 5 5 5 5   L 5 5 5 5 5 5 5    Side Iliopsoas Quads Hamstring PF DF EHL  R 5 5 5 5 5 5   L 5 5 5 5 5 5    Reflexes are 1+ and symmetric at the biceps, triceps, brachioradialis, patella and achilles.   Hoffman's is absent.   Bilateral upper and lower extremity sensation is intact to light touch.    No evidence of dysmetria noted.  Gait is normal.     Medical Decision Making  Imaging: MRI C spine 11/20/2023 IMPRESSION: 1. 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 2. Mild bilateral foraminal narrowing C4-5. 3. Moderate left foraminal narrowing C5-6.     Electronically Signed   By: Debby Prader M.D.   On: 11/29/2023 08:56  I have personally reviewed the images and agree with the above interpretation.  Assessment and Plan: Brian Farmer is a pleasant 68 y.o. male with left C4 radiculopathy.  He has anterolisthesis of C3 on C4 with compression of his left C4 nerve root.  He also has retrolisthesis at C4 and C5.  He has tried and failed conservative management.  No further conservative management is indicated.  Have recommended C3-4 anterior cervical discectomy and fusion.  We discussed the utility of including C4-5 or C5-6, but I ultimately recommended that he start with a C3-4 intervention.    I discussed the planned procedure at length with the patient, including the risks, benefits, alternatives, and indications. The risks discussed include but are not limited to bleeding, infection, need for reoperation, spinal fluid leak, stroke, vision loss, anesthetic complication, coma, paralysis, and even death. We also discussed the possibility of post-operative dysphagia, vocal cord paralysis, and the risk of adjacent segment disease in the future. I also described in detail that improvement was not guaranteed.  The patient expressed understanding of these risks, and asked that we proceed with surgery. I described the surgery in layman's terms, and gave ample opportunity for questions, which were answered to the best of my ability.  I spent a total of 30 minutes in this patient's care today. This time was spent reviewing pertinent records including imaging studies, obtaining and confirming history, performing a directed evaluation, formulating and discussing my recommendations, and documenting the visit within the medical record.      Thank you for  involving me in the care of this patient.      Sharon Stapel K. Clois MD, [facility] Neurosurgery

## 2024-06-29 ENCOUNTER — Other Ambulatory Visit

## 2024-06-30 ENCOUNTER — Other Ambulatory Visit: Payer: Self-pay

## 2024-06-30 ENCOUNTER — Encounter
Admission: RE | Admit: 2024-06-30 | Discharge: 2024-06-30 | Disposition: A | Discharge status: Home / Self Care | Source: Ambulatory Visit | Attending: Neurosurgery | Admitting: Neurosurgery

## 2024-06-30 VITALS — BP 124/64 | HR 69 | Resp 16 | Wt 176.4 lb

## 2024-06-30 DIAGNOSIS — E109 Type 1 diabetes mellitus without complications: Secondary | ICD-10-CM | POA: Insufficient documentation

## 2024-06-30 DIAGNOSIS — Z01812 Encounter for preprocedural laboratory examination: Secondary | ICD-10-CM

## 2024-06-30 DIAGNOSIS — Z01818 Encounter for other preprocedural examination: Secondary | ICD-10-CM | POA: Insufficient documentation

## 2024-06-30 DIAGNOSIS — I10 Essential (primary) hypertension: Secondary | ICD-10-CM | POA: Diagnosis not present

## 2024-06-30 HISTORY — DX: Headache, unspecified: R51.9

## 2024-06-30 HISTORY — DX: Spondylolisthesis, cervical region: M43.12

## 2024-06-30 HISTORY — DX: Unspecified osteoarthritis, unspecified site: M19.90

## 2024-06-30 HISTORY — DX: Cervical root disorders, not elsewhere classified: G54.2

## 2024-06-30 HISTORY — DX: Spondylosis without myelopathy or radiculopathy, cervical region: M47.812

## 2024-06-30 HISTORY — DX: Radiculopathy, site unspecified: M54.10

## 2024-06-30 LAB — CBC
HCT: 36 % — ABNORMAL LOW (ref 39.0–52.0)
Hemoglobin: 12.2 g/dL — ABNORMAL LOW (ref 13.0–17.0)
MCH: 31.5 pg (ref 26.0–34.0)
MCHC: 33.9 g/dL (ref 30.0–36.0)
MCV: 93 fL (ref 80.0–100.0)
Platelets: 153 K/uL (ref 150–400)
RBC: 3.87 MIL/uL — ABNORMAL LOW (ref 4.22–5.81)
RDW: 12.6 % (ref 11.5–15.5)
WBC: 9.7 K/uL (ref 4.0–10.5)
nRBC: 0 % (ref 0.0–0.2)

## 2024-06-30 LAB — BASIC METABOLIC PANEL WITH GFR
Anion gap: 7 (ref 5–15)
BUN: 22 mg/dL (ref 8–23)
CO2: 22 mmol/L (ref 22–32)
Calcium: 9 mg/dL (ref 8.9–10.3)
Chloride: 109 mmol/L (ref 98–111)
Creatinine, Ser: 1.03 mg/dL (ref 0.61–1.24)
GFR, Estimated: >60 mL/min (ref >60)
Glucose, Bld: 141 mg/dL — ABNORMAL HIGH (ref 70–99)
Potassium: 3.7 mmol/L (ref 3.5–5.1)
Sodium: 138 mmol/L (ref 135–145)

## 2024-06-30 LAB — SURGICAL PCR SCREEN
MRSA, PCR: NEGATIVE
Staphylococcus aureus: NEGATIVE

## 2024-06-30 NOTE — Pre-Procedure Instructions (Signed)
 DM consult made for scheduled surgery on 07/06/24, patient Type 1 diabetic with Omnipod and Dexcom meter in place, Dr. MALVA' Pecolia made aware, Dr. Clois made aware, DM Coordinator Earnie, made aware.

## 2024-06-30 NOTE — Patient Instructions (Addendum)
 Your procedure is scheduled on: 07/06/24 - Wednesday Report to the Registration Desk on the 1st floor of the [facility]. To find out your arrival time, please call [phone] between 1PM - 3PM on: 07/05/24 - Tuesday If your arrival time is 6:00 am, do not arrive before that time as the [facility] entrance doors do not open until 6:00 am.  REMEMBER: Instructions that are not followed completely may result in serious medical risk, up to and including death; or upon the discretion of your surgeon and anesthesiologist your surgery may need to be rescheduled.  Do not eat food after midnight the night before surgery.  No gum chewing or hard candies.  You may however, drink CLEAR liquids up to 2 hours before you are scheduled to arrive for your surgery. Do not drink anything within 2 hours of your scheduled arrival time.  Clear liquids include: - water   You may continue these medication as needed on up until your surgery, Anti-inflammatories (NSAIDS) such as Advil, Aleve , Ibuprofen, Motrin, Naproxen , Naprosyn  and Aspirin based products such as Excedrin, Goody's Powder, BC Powder. You may take Tylenol if needed for pain up until the day of surgery.  NSAIDS (Non-steroidal anti-inflammatory drugs): because you are having a fusion, please avoid taking any NSAIDS (examples: ibuprofen, motrin, aleve , naproxen , meloxicam, diclofenac) for 3 months after surgery. Celebrex is an exception and is OK to take, if prescribed. Tylenol is not an NSAID.   Stop ANY OVER THE COUNTER supplements until after surgery.  Omnipod Insulin Pump- Follow up with Dr. Cherilyn before your procedure.  HOLD tadalafil  (CIALIS ) 2 days prior to surgery.  ON THE DAY OF SURGERY ONLY TAKE THESE MEDICATIONS WITH SIPS OF WATER:  amLODipine (NORVASC)  pantoprazole (PROTONIX)  tamsulosin  (FLOMAX )   No Alcohol for 24 hours before or after surgery.  No Smoking including e-cigarettes for 24 hours before surgery.  No  chewable tobacco products for at least 6 hours before surgery.  No nicotine patches on the day of surgery.  Do not use any recreational drugs for at least a week (preferably 2 weeks) before your surgery.  Please be advised that the combination of cocaine and anesthesia may have negative outcomes, up to and including death. If you test positive for cocaine, your surgery will be cancelled.  On the morning of surgery brush your teeth with toothpaste and water, you may rinse your mouth with mouthwash if you wish. Do not swallow any toothpaste or mouthwash.  Use CHG Soap or wipes as directed on instruction sheet.  Do not wear jewelry, make-up, hairpins, clips or nail polish.  For welded (permanent) jewelry: bracelets, anklets, waist bands, etc.  Please have this removed prior to surgery.  If it is not removed, there is a chance that hospital personnel will need to cut it off on the day of surgery.  Do not wear lotions, powders, or perfumes.   Do not shave body hair from the neck down 48 hours before surgery.  Contact lenses, hearing aids and dentures may not be worn into surgery.  Do not bring valuables to the hospital. [facility] is not responsible for any missing/lost belongings or valuables.   Notify your doctor if there is any change in your medical condition (cold, fever, infection).  Wear comfortable clothing (specific to your surgery type) to the hospital.  After surgery, you can help prevent lung complications by doing breathing exercises.  Take deep breaths and cough every 1-2 hours. Your doctor may order a  device called an Incentive Spirometer to help you take deep breaths.  When coughing or sneezing, hold a pillow firmly against your incision with both hands. This is called "splinting." Doing this helps protect your incision. It also decreases belly discomfort.  If you are being admitted to the hospital overnight, leave your suitcase in the car. After surgery it may be  brought to your room.  In case of increased patient census, it may be necessary for you, the patient, to continue your postoperative care in the Same Day Surgery department.  If you are being discharged the day of surgery, you will not be allowed to drive home. You will need a responsible individual to drive you home and stay with you for 24 hours after surgery.   If you are taking public transportation, you will need to have a responsible individual with you.  Please call the Pre-admissions Testing Dept. at [phone] if you have any questions about these instructions.  Surgery Visitation Policy:  Patients having surgery or a procedure may have two visitors.  Children under the age of 50 must have an adult with them who is not the patient.  Inpatient Visitation:    Visiting hours are 7 a.m. to 8 p.m. Up to four visitors are allowed at one time in a patient room. The visitors may rotate out with other people during the day.  One visitor age 61 or older may stay with the patient overnight and must be in the room by 8 p.m.   Merchandiser, retail to address health-related social needs:  [email or web address]    Pre-operative 5 CHG Bath Instructions   You can play a key role in reducing the risk of infection after surgery. Your skin needs to be as free of germs as possible. You can reduce the number of germs on your skin by washing with CHG (chlorhexidine gluconate) soap before surgery. CHG is an antiseptic soap that kills germs and continues to kill germs even after washing.   DO NOT use if you have an allergy  to chlorhexidine/CHG or antibacterial soaps. If your skin becomes reddened or irritated, stop using the CHG and notify one of our RNs at [phone].   Please shower with the CHG soap starting 4 days before surgery using the following schedule: 08/30 - 09/03.    Please keep in mind the following:  DO NOT shave, including legs and underarms, starting  the day of your first shower.   You may shave your face at any point before/day of surgery.  Place clean sheets on your bed the day you start using CHG soap. Use a clean washcloth (not used since being washed) for each shower. DO NOT sleep with pets once you start using the CHG.   CHG Shower Instructions:  If you choose to wash your hair and private area, wash first with your normal shampoo/soap.  After you use shampoo/soap, rinse your hair and body thoroughly to remove shampoo/soap residue.  Turn the water OFF and apply about 3 tablespoons (45 ml) of CHG soap to a CLEAN washcloth.  Apply CHG soap ONLY FROM YOUR NECK DOWN TO YOUR TOES (washing for 3-5 minutes)  DO NOT use CHG soap on face, private areas, open wounds, or sores.  Pay special attention to the area where your surgery is being performed.  If you are having back surgery, having someone wash your back for you may be helpful. Wait 2 minutes after CHG soap is applied, then you may rinse off the  CHG soap.  Pat dry with a clean towel  Put on clean clothes/pajamas   If you choose to wear lotion, please use ONLY the CHG-compatible lotions on the back of this paper.     Additional instructions for the day of surgery: DO NOT APPLY any lotions, deodorants, cologne, or perfumes.   Put on clean/comfortable clothes.  Brush your teeth.  Ask your nurse before applying any prescription medications to the skin.      CHG Compatible Lotions   Aveeno Moisturizing lotion  Cetaphil Moisturizing Cream  Cetaphil Moisturizing Lotion  Clairol Herbal Essence Moisturizing Lotion, Dry Skin  Clairol Herbal Essence Moisturizing Lotion, Extra Dry Skin  Clairol Herbal Essence Moisturizing Lotion, Normal Skin  Curel Age Defying Therapeutic Moisturizing Lotion with Alpha Hydroxy  Curel Extreme Care Body Lotion  Curel Soothing Hands Moisturizing Hand Lotion  Curel Therapeutic Moisturizing Cream, Fragrance-Free  Curel Therapeutic Moisturizing Lotion,  Fragrance-Free  Curel Therapeutic Moisturizing Lotion, Original Formula  Eucerin Daily Replenishing Lotion  Eucerin Dry Skin Therapy Plus Alpha Hydroxy Crme  Eucerin Dry Skin Therapy Plus Alpha Hydroxy Lotion  Eucerin Original Crme  Eucerin Original Lotion  Eucerin Plus Crme Eucerin Plus Lotion  Eucerin TriLipid Replenishing Lotion  Keri Anti-Bacterial Hand Lotion  Keri Deep Conditioning Original Lotion Dry Skin Formula Softly Scented  Keri Deep Conditioning Original Lotion, Fragrance Free Sensitive Skin Formula  Keri Lotion Fast Absorbing Fragrance Free Sensitive Skin Formula  Keri Lotion Fast Absorbing Softly Scented Dry Skin Formula  Keri Original Lotion  Keri Skin Renewal Lotion Keri Silky Smooth Lotion  Keri Silky Smooth Sensitive Skin Lotion  Nivea Body Creamy Conditioning Oil  Nivea Body Extra Enriched Teacher, adult education Moisturizing Lotion Nivea Crme  Nivea Skin Firming Lotion  NutraDerm 30 Skin Lotion  NutraDerm Skin Lotion  NutraDerm Therapeutic Skin Cream  NutraDerm Therapeutic Skin Lotion  ProShield Protective Hand Cream  Provon moisturizing lotion

## 2024-07-06 ENCOUNTER — Encounter
Admission: RE | Disposition: A | Discharge status: Home / Self Care | Payer: Self-pay | Source: Home / Self Care | Attending: Neurosurgery

## 2024-07-06 ENCOUNTER — Ambulatory Visit
Admission: RE | Admit: 2024-07-06 | Discharge: 2024-07-06 | Disposition: A | Discharge status: Home / Self Care | Attending: Neurosurgery | Admitting: Neurosurgery

## 2024-07-06 ENCOUNTER — Ambulatory Visit: Payer: Self-pay | Admitting: Urgent Care

## 2024-07-06 ENCOUNTER — Other Ambulatory Visit: Payer: Self-pay

## 2024-07-06 ENCOUNTER — Encounter: Payer: Self-pay | Admitting: Neurosurgery

## 2024-07-06 ENCOUNTER — Ambulatory Visit

## 2024-07-06 DIAGNOSIS — M4802 Spinal stenosis, cervical region: Secondary | ICD-10-CM | POA: Diagnosis not present

## 2024-07-06 DIAGNOSIS — M4312 Spondylolisthesis, cervical region: Secondary | ICD-10-CM | POA: Insufficient documentation

## 2024-07-06 DIAGNOSIS — Z794 Long term (current) use of insulin: Secondary | ICD-10-CM | POA: Insufficient documentation

## 2024-07-06 DIAGNOSIS — E119 Type 2 diabetes mellitus without complications: Secondary | ICD-10-CM | POA: Insufficient documentation

## 2024-07-06 DIAGNOSIS — K219 Gastro-esophageal reflux disease without esophagitis: Secondary | ICD-10-CM | POA: Diagnosis not present

## 2024-07-06 DIAGNOSIS — F17290 Nicotine dependence, other tobacco product, uncomplicated: Secondary | ICD-10-CM | POA: Diagnosis not present

## 2024-07-06 DIAGNOSIS — Z791 Long term (current) use of non-steroidal anti-inflammatories (NSAID): Secondary | ICD-10-CM | POA: Insufficient documentation

## 2024-07-06 DIAGNOSIS — E109 Type 1 diabetes mellitus without complications: Secondary | ICD-10-CM

## 2024-07-06 DIAGNOSIS — Z79899 Other long term (current) drug therapy: Secondary | ICD-10-CM | POA: Insufficient documentation

## 2024-07-06 DIAGNOSIS — Z9641 Presence of insulin pump (external) (internal): Secondary | ICD-10-CM | POA: Diagnosis not present

## 2024-07-06 DIAGNOSIS — J449 Chronic obstructive pulmonary disease, unspecified: Secondary | ICD-10-CM | POA: Diagnosis not present

## 2024-07-06 DIAGNOSIS — M5412 Radiculopathy, cervical region: Secondary | ICD-10-CM | POA: Insufficient documentation

## 2024-07-06 DIAGNOSIS — I10 Essential (primary) hypertension: Secondary | ICD-10-CM | POA: Insufficient documentation

## 2024-07-06 DIAGNOSIS — M2578 Osteophyte, vertebrae: Secondary | ICD-10-CM | POA: Insufficient documentation

## 2024-07-06 DIAGNOSIS — Z01812 Encounter for preprocedural laboratory examination: Secondary | ICD-10-CM

## 2024-07-06 DIAGNOSIS — Z01818 Encounter for other preprocedural examination: Secondary | ICD-10-CM

## 2024-07-06 DIAGNOSIS — R519 Headache, unspecified: Secondary | ICD-10-CM | POA: Diagnosis not present

## 2024-07-06 HISTORY — PX: ANTERIOR CERVICAL DECOMP/DISCECTOMY FUSION: SHX1161

## 2024-07-06 LAB — GLUCOSE, CAPILLARY
Glucose-Capillary: 123 mg/dL — ABNORMAL HIGH (ref 70–99)
Glucose-Capillary: 149 mg/dL — ABNORMAL HIGH (ref 70–99)

## 2024-07-06 SURGERY — ANTERIOR CERVICAL DECOMPRESSION/DISCECTOMY FUSION 1 LEVEL
Anesthesia: General | Site: Spine Cervical

## 2024-07-06 MED ORDER — 0.9 % SODIUM CHLORIDE (POUR BTL) OPTIME
TOPICAL | Status: DC | PRN
  Administered 2024-07-06: 500 mL

## 2024-07-06 MED ORDER — PHENYLEPHRINE 80 MCG/ML (10ML) SYRINGE FOR IV PUSH (FOR BLOOD PRESSURE SUPPORT)
PREFILLED_SYRINGE | INTRAVENOUS | Status: DC | PRN
Start: 2024-07-06 — End: 2024-07-06
  Administered 2024-07-06: 240 ug via INTRAVENOUS
  Administered 2024-07-06: 80 ug via INTRAVENOUS
  Administered 2024-07-06: 160 ug via INTRAVENOUS
  Administered 2024-07-06: 240 ug via INTRAVENOUS

## 2024-07-06 MED ORDER — VASOPRESSIN 20 UNIT/ML IV SOLN
INTRAVENOUS | Status: DC | PRN
  Administered 2024-07-06: 1 [IU] via INTRAVENOUS

## 2024-07-06 MED ORDER — DEXAMETHASONE SODIUM PHOSPHATE 10 MG/ML IJ SOLN
INTRAMUSCULAR | Status: AC
  Filled 2024-07-06: qty 1

## 2024-07-06 MED ORDER — LIDOCAINE HCL (CARDIAC) PF 100 MG/5ML IV SOSY
PREFILLED_SYRINGE | INTRAVENOUS | Status: DC | PRN
Start: 2024-07-06 — End: 2024-07-06
  Administered 2024-07-06: 60 mg via INTRAVENOUS

## 2024-07-06 MED ORDER — ONDANSETRON HCL 4 MG/2ML IJ SOLN
INTRAMUSCULAR | Status: AC
  Filled 2024-07-06: qty 2

## 2024-07-06 MED ORDER — METHOCARBAMOL 500 MG PO TABS
500.0000 mg | ORAL_TABLET | Freq: Once | ORAL | Status: AC
  Administered 2024-07-06: 500 mg via ORAL

## 2024-07-06 MED ORDER — OXYCODONE HCL 5 MG/5ML PO SOLN: 5.0000 mg | Freq: Once | ORAL | Status: AC | PRN

## 2024-07-06 MED ORDER — LACTATED RINGERS IV SOLN: INTRAVENOUS | Status: DC | PRN

## 2024-07-06 MED ORDER — MIDAZOLAM HCL 2 MG/2ML IJ SOLN
INTRAMUSCULAR | Status: DC | PRN
Start: 2024-07-06 — End: 2024-07-06
  Administered 2024-07-06: 2 mg via INTRAVENOUS

## 2024-07-06 MED ORDER — BUPIVACAINE-EPINEPHRINE (PF) 0.5% -1:200000 IJ SOLN
INTRAMUSCULAR | Status: AC
Start: 2024-07-06 — End: 2024-07-06
  Filled 2024-07-06: qty 10

## 2024-07-06 MED ORDER — DROPERIDOL 2.5 MG/ML IJ SOLN
INTRAMUSCULAR | Status: AC
  Filled 2024-07-06: qty 2

## 2024-07-06 MED ORDER — FENTANYL CITRATE (PF) 100 MCG/2ML IJ SOLN
INTRAMUSCULAR | Status: DC | PRN
  Administered 2024-07-06 (×2): 50 ug via INTRAVENOUS

## 2024-07-06 MED ORDER — OXYCODONE HCL 5 MG PO TABS: 5.0000 mg | ORAL_TABLET | ORAL | Status: DC | PRN

## 2024-07-06 MED ORDER — PROPOFOL 10 MG/ML IV BOLUS
INTRAVENOUS | Status: DC | PRN
  Administered 2024-07-06: 160 mg via INTRAVENOUS

## 2024-07-06 MED ORDER — ONDANSETRON HCL 4 MG/2ML IJ SOLN
INTRAMUSCULAR | Status: DC | PRN
  Administered 2024-07-06: 4 mg via INTRAVENOUS

## 2024-07-06 MED ORDER — CEFAZOLIN SODIUM-DEXTROSE 2-4 GM/100ML-% IV SOLN
INTRAVENOUS | Status: AC
  Filled 2024-07-06: qty 100

## 2024-07-06 MED ORDER — SENNA 8.6 MG PO TABS
1.0000 | ORAL_TABLET | Freq: Two times a day (BID) | ORAL | 0 refills | Status: DC | PRN
  Filled 2024-07-06: qty 30, 15d supply, fill #0

## 2024-07-06 MED ORDER — LIDOCAINE HCL (PF) 2 % IJ SOLN
INTRAMUSCULAR | Status: AC
  Filled 2024-07-06: qty 5

## 2024-07-06 MED ORDER — EPHEDRINE SULFATE-NACL 50-0.9 MG/10ML-% IV SOSY
PREFILLED_SYRINGE | INTRAVENOUS | Status: DC | PRN
Start: 2024-07-06 — End: 2024-07-06
  Administered 2024-07-06: 10 mg via INTRAVENOUS
  Administered 2024-07-06 (×2): 5 mg via INTRAVENOUS

## 2024-07-06 MED ORDER — OXYCODONE HCL 5 MG PO TABS
ORAL_TABLET | ORAL | Status: AC
Start: 2024-07-06 — End: 2024-07-06
  Filled 2024-07-06: qty 1

## 2024-07-06 MED ORDER — REMIFENTANIL HCL 1 MG IV SOLR
INTRAVENOUS | Status: AC
  Filled 2024-07-06: qty 1000

## 2024-07-06 MED ORDER — DROPERIDOL 2.5 MG/ML IJ SOLN
0.6250 mg | Freq: Once | INTRAMUSCULAR | Status: AC
  Administered 2024-07-06: 0.625 mg via INTRAVENOUS
  Filled 2024-07-06: qty 2

## 2024-07-06 MED ORDER — OXYCODONE HCL 5 MG PO TABS
5.0000 mg | ORAL_TABLET | ORAL | 0 refills | Status: AC | PRN
  Filled 2024-07-06: qty 30, 5d supply, fill #0

## 2024-07-06 MED ORDER — FENTANYL CITRATE (PF) 100 MCG/2ML IJ SOLN
INTRAMUSCULAR | Status: AC
  Filled 2024-07-06: qty 2

## 2024-07-06 MED ORDER — REMIFENTANIL HCL 2 MG IV SOLR
INTRAVENOUS | Status: DC | PRN
  Administered 2024-07-06: .15 ug/kg/min via INTRAVENOUS

## 2024-07-06 MED ORDER — ACETAMINOPHEN 10 MG/ML IV SOLN
INTRAVENOUS | Status: AC
  Filled 2024-07-06: qty 100

## 2024-07-06 MED ORDER — METHOCARBAMOL 500 MG PO TABS
ORAL_TABLET | ORAL | Status: AC
  Filled 2024-07-06: qty 1

## 2024-07-06 MED ORDER — DEXAMETHASONE SODIUM PHOSPHATE 10 MG/ML IJ SOLN
INTRAMUSCULAR | Status: DC | PRN
Start: 2024-07-06 — End: 2024-07-06
  Administered 2024-07-06: 10 mg via INTRAVENOUS

## 2024-07-06 MED ORDER — PHENYLEPHRINE HCL-NACL 20-0.9 MG/250ML-% IV SOLN
INTRAVENOUS | Status: AC
  Filled 2024-07-06: qty 250

## 2024-07-06 MED ORDER — CHLORHEXIDINE GLUCONATE 0.12 % MT SOLN
OROMUCOSAL | Status: AC
Start: 2024-07-06 — End: 2024-07-06
  Filled 2024-07-06: qty 15

## 2024-07-06 MED ORDER — OXYCODONE HCL 5 MG PO TABS
ORAL_TABLET | ORAL | Status: AC
  Filled 2024-07-06: qty 1

## 2024-07-06 MED ORDER — EPHEDRINE 5 MG/ML INJ
INTRAVENOUS | Status: AC
  Filled 2024-07-06: qty 5

## 2024-07-06 MED ORDER — FENTANYL CITRATE (PF) 100 MCG/2ML IJ SOLN
25.0000 ug | INTRAMUSCULAR | Status: DC | PRN
  Administered 2024-07-06 (×4): 50 ug via INTRAVENOUS

## 2024-07-06 MED ORDER — CEFAZOLIN IN SODIUM CHLORIDE 2-0.9 GM/100ML-% IV SOLN
2.0000 g | Freq: Once | INTRAVENOUS | Status: AC
  Administered 2024-07-06: 2 g via INTRAVENOUS
  Filled 2024-07-06: qty 100

## 2024-07-06 MED ORDER — BUPIVACAINE-EPINEPHRINE (PF) 0.5% -1:200000 IJ SOLN
INTRAMUSCULAR | Status: DC | PRN
  Administered 2024-07-06: 4 mL

## 2024-07-06 MED ORDER — SUCCINYLCHOLINE CHLORIDE 200 MG/10ML IV SOSY
PREFILLED_SYRINGE | INTRAVENOUS | Status: DC | PRN
Start: 2024-07-06 — End: 2024-07-06
  Administered 2024-07-06: 160 mg via INTRAVENOUS

## 2024-07-06 MED ORDER — SURGIFLO WITH THROMBIN (HEMOSTATIC MATRIX KIT) OPTIME
TOPICAL | Status: DC | PRN
Start: 2024-07-06 — End: 2024-07-06
  Administered 2024-07-06: 1 via TOPICAL

## 2024-07-06 MED ORDER — CHLORHEXIDINE GLUCONATE 0.12 % MT SOLN: 15.0000 mL | Freq: Once | OROMUCOSAL | Status: DC

## 2024-07-06 MED ORDER — PHENYLEPHRINE HCL-NACL 20-0.9 MG/250ML-% IV SOLN
INTRAVENOUS | Status: DC | PRN
  Administered 2024-07-06: 20 ug/min via INTRAVENOUS

## 2024-07-06 MED ORDER — SODIUM CHLORIDE 0.9 % IV SOLN: INTRAVENOUS | Status: DC

## 2024-07-06 MED ORDER — MIDAZOLAM HCL 2 MG/2ML IJ SOLN
INTRAMUSCULAR | Status: AC
  Filled 2024-07-06: qty 2

## 2024-07-06 MED ORDER — OXYCODONE HCL 5 MG PO TABS
5.0000 mg | ORAL_TABLET | Freq: Once | ORAL | Status: AC | PRN
  Administered 2024-07-06: 5 mg via ORAL

## 2024-07-06 MED ORDER — METHOCARBAMOL 500 MG PO TABS
500.0000 mg | ORAL_TABLET | Freq: Four times a day (QID) | ORAL | 0 refills | Status: DC
  Filled 2024-07-06: qty 120, 30d supply, fill #0

## 2024-07-06 MED ORDER — VASOPRESSIN 20 UNIT/ML IV SOLN
INTRAVENOUS | Status: AC
  Filled 2024-07-06: qty 1

## 2024-07-06 MED ORDER — ORAL CARE MOUTH RINSE: 15.0000 mL | Freq: Once | OROMUCOSAL | Status: DC

## 2024-07-06 MED ORDER — PROPOFOL 1000 MG/100ML IV EMUL
INTRAVENOUS | Status: AC
  Filled 2024-07-06: qty 100

## 2024-07-06 MED ORDER — FENTANYL CITRATE (PF) 100 MCG/2ML IJ SOLN
INTRAMUSCULAR | Status: AC
Start: 2024-07-06 — End: 2024-07-06
  Filled 2024-07-06: qty 2

## 2024-07-06 SURGICAL SUPPLY — 38 items
ALLOGRAFT BONE FIBER KORE 1CC (Bone Implant) IMPLANT
BASIN KIT SINGLE STR (MISCELLANEOUS) ×1 IMPLANT
BUR NEURO DRILL SOFT 3.0X3.8M (BURR) ×1 IMPLANT
DERMABOND ADVANCED .7 DNX12 (GAUZE/BANDAGES/DRESSINGS) ×1 IMPLANT
DRAIN CHANNEL JP 10F RND 20C F (MISCELLANEOUS) IMPLANT
DRAPE C ARM PK CFD 31 SPINE (DRAPES) ×1 IMPLANT
DRAPE LAPAROTOMY 77X122 PED (DRAPES) ×1 IMPLANT
DRAPE MICROSCOPE SPINE 48X150 (DRAPES) ×1 IMPLANT
DRSG TEGADERM 4X4.75 (GAUZE/BANDAGES/DRESSINGS) IMPLANT
ELECTRODE REM PT RTRN 9FT ADLT (ELECTROSURGICAL) ×1 IMPLANT
EVACUATOR SILICONE 100CC (DRAIN) IMPLANT
FEE INTRAOP CADWELL SUPPLY NCS (MISCELLANEOUS) IMPLANT
FEE INTRAOP MONITOR IMPULS NCS (MISCELLANEOUS) IMPLANT
GAUZE SPONGE 2X2 STRL 8-PLY (GAUZE/BANDAGES/DRESSINGS) IMPLANT
GLOVE BIOGEL PI IND STRL 6.5 (GLOVE) ×1 IMPLANT
GLOVE SURG SYN 6.5 PF PI (GLOVE) ×1 IMPLANT
GLOVE SURG SYN 8.5 PF PI (GLOVE) ×3 IMPLANT
GOWN SRG LRG LVL 4 IMPRV REINF (GOWNS) ×1 IMPLANT
GOWN SRG XL LVL 3 NONREINFORCE (GOWNS) ×1 IMPLANT
KIT TURNOVER KIT A (KITS) ×1 IMPLANT
MANIFOLD NEPTUNE II (INSTRUMENTS) ×1 IMPLANT
NS IRRIG 500ML POUR BTL (IV SOLUTION) ×1 IMPLANT
PACK LAMINECTOMY ARMC (PACKS) ×1 IMPLANT
PAD ARMBOARD POSITIONER FOAM (MISCELLANEOUS) ×2 IMPLANT
PIN CASPAR 14 (PIN) ×1 IMPLANT
PIN CASPAR SPINAL 14MM CERVICA (PIN) IMPLANT
PLATE ACP 1.6 18 (Plate) IMPLANT
SCREW ACP 3.5X17 S/D VARIA (Screw) IMPLANT
SPACER HEDRON C 12X14X8 7D (Spacer) IMPLANT
SPONGE KITTNER 5P (MISCELLANEOUS) ×1 IMPLANT
STAPLER SKIN PROX 35W (STAPLE) IMPLANT
SURGIFLO W/THROMBIN 8M KIT (HEMOSTASIS) ×1 IMPLANT
SUT STRATA 3-0 15 PS-2 (SUTURE) ×1 IMPLANT
SUT VIC AB 3-0 SH 8-18 (SUTURE) ×1 IMPLANT
SUTURE EHLN 3-0 FS-10 30 BLK (SUTURE) IMPLANT
SYR 20ML LL LF (SYRINGE) ×1 IMPLANT
TAPE CLOTH 3X10 WHT NS LF (GAUZE/BANDAGES/DRESSINGS) ×2 IMPLANT
TRAP FLUID SMOKE EVACUATOR (MISCELLANEOUS) ×1 IMPLANT

## 2024-07-06 NOTE — Progress Notes (Signed)
 Patient awake/alert x4. Moving all ext without event. Medicated as ordered. States pain 6/10, Ice to posterior neck area. Patient tolerating po fluids and crackers without event.  Dr. Clois spoke to patient at bedside, surgery went well. Patient verbalized understanding.

## 2024-07-06 NOTE — Transfer of Care (Cosign Needed)
 Immediate Anesthesia Transfer of Care Note  Patient: Adger Cantera  Procedure(s) Performed: ANTERIOR CERVICAL DECOMPRESSION/DISCECTOMY FUSION 1 LEVEL (Spine Cervical)  Patient Location: PACU  Anesthesia Type:General  Level of Consciousness: awake, oriented, and patient cooperative  Airway & Oxygen Therapy: Patient Spontanous Breathing and Patient connected to face mask oxygen  Post-op Assessment: Report given to RN and Post -op Vital signs reviewed and stable  Post vital signs: Reviewed and stable  Last Vitals:  Vitals Value Taken Time  BP 130/63 07/06/24 12:50  Temp    Pulse 85 07/06/24 12:55  Resp 15 07/06/24 12:55  SpO2 100 % 07/06/24 12:55  Vitals shown include unfiled device data.  Last Pain:  Vitals:   07/06/24 1008  TempSrc: Temporal  PainSc: 3          Complications: No notable events documented.

## 2024-07-06 NOTE — Anesthesia Preprocedure Evaluation (Signed)
 Anesthesia Evaluation  Patient identified by MRN, date of birth, ID band Patient awake    Reviewed: Allergy  & Precautions, NPO status , Patient's Chart, lab work & pertinent test results  History of Anesthesia Complications Negative for: history of anesthetic complications  Airway Mallampati: III  TM Distance: <3 FB Neck ROM: full    Dental  (+) Chipped   Pulmonary COPD, Current Smoker   Pulmonary exam normal        Cardiovascular Exercise Tolerance: Good hypertension, (-) Past MI Normal cardiovascular exam     Neuro/Psych  Headaches  Neuromuscular disease  negative psych ROS   GI/Hepatic Neg liver ROS,GERD  Controlled,,  Endo/Other  diabetes, Type 2    Renal/GU      Musculoskeletal   Abdominal   Peds  Hematology negative hematology ROS (+)   Anesthesia Other Findings Past Medical History: No date: Allergic rhinitis No date: Anterolisthesis of cervical spine No date: Arthritis No date: BPH (benign prostatic hyperplasia) No date: Cervical disc disease No date: Cervical nerve root compression No date: DDD (degenerative disc disease), cervical No date: Diabetes mellitus without complication (HCC) No date: Erectile disorder 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 No date: Family history of colon cancer No date: Family history of leukemia No date: Family history of leukemia No date: GERD (gastroesophageal reflux disease) No date: Headache     Comment:  migraine No date: Hyperlipidemia No date: Hypertension No date: Lynch syndrome No date: Pancreatitis No date: Radiculopathy affecting upper extremity 11/05/2018: 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 No date: Spondylosis of cervical spine  Past Surgical History: No date: ANKLE SURGERY; Right     Comment:  torn tendon repair 08/02/2015: COLONOSCOPY WITH PROPOFOL ; N/A     Comment:  Procedure: COLONOSCOPY WITH PROPOFOL ;  Surgeon: Deward CINDERELLA Piedmont, MD;  Location: [facility] ENDOSCOPY;  Service:               Gastroenterology;  Laterality: N/A; 03/04/2017: COLONOSCOPY WITH PROPOFOL ; N/A     Comment:  Procedure: COLONOSCOPY WITH PROPOFOL ;  Surgeon: Lamar ONEIDA Holmes, MD;  Location: [facility] ENDOSCOPY;  Service:               Endoscopy;  Laterality: N/A; 03/04/2017: ESOPHAGOGASTRODUODENOSCOPY (EGD) WITH PROPOFOL ; N/A     Comment:  Procedure: ESOPHAGOGASTRODUODENOSCOPY (EGD) WITH               PROPOFOL ;  Surgeon: Lamar ONEIDA Holmes, MD;  Location: [facility]              ENDOSCOPY;  Service: Endoscopy;  Laterality: N/A; No date: SHOULDER SURGERY; Right     Comment:  rotator cuff repair No date: TRIGGER FINGER RELEASE; Bilateral     Reproductive/Obstetrics negative OB ROS                              Anesthesia Physical Anesthesia Plan  ASA: 3  Anesthesia Plan: General ETT   Post-op Pain Management:    Induction: Intravenous  PONV Risk Score and Plan: Ondansetron , Dexamethasone , Midazolam  and Treatment may vary due to age or medical condition  Airway Management Planned: Oral ETT  Additional Equipment:   Intra-op Plan:   Post-operative Plan: Extubation in OR  Informed Consent: I have reviewed the patients History and Physical, chart, labs and  discussed the procedure including the risks, benefits and alternatives for the proposed anesthesia with the patient or authorized representative who has indicated his/her understanding and acceptance.     Dental Advisory Given  Plan Discussed with: Anesthesiologist, CRNA and Surgeon  Anesthesia Plan Comments: (Patient consented for risks of anesthesia including but not limited to:  - adverse reactions to medications - damage to eyes, teeth, lips or other oral mucosa - nerve damage due to positioning  - sore throat or hoarseness - Damage to heart, brain, nerves, lungs, other parts of body or loss of life  Patient voiced understanding and assent.)         Anesthesia Quick Evaluation

## 2024-07-06 NOTE — Anesthesia Procedure Notes (Cosign Needed)
 Procedure Name: Intubation Date/Time: 07/06/2024 11:24 AM  Performed by: Stevan Fairy POUR, MDPre-anesthesia Checklist: Patient identified, Emergency Drugs available, Suction available and Patient being monitored Patient Re-evaluated:Patient Re-evaluated prior to induction Oxygen Delivery Method: Circle system utilized Preoxygenation: Pre-oxygenation with 100% oxygen Induction Type: IV induction Ventilation: Mask ventilation without difficulty Laryngoscope Size: McGrath and 4 Grade View: Grade I Tube type: Oral Tube size: 7.5 mm Number of attempts: 1 Airway Equipment and Method: Stylet and Oral airway Placement Confirmation: ETT inserted through vocal cords under direct vision, positive ETCO2 and breath sounds checked- equal and bilateral Secured at: 23 cm Tube secured with: Tape Dental Injury: Teeth and Oropharynx as per pre-operative assessment

## 2024-07-06 NOTE — Discharge Summary (Signed)
 Discharge Summary  Patient ID: Brian Farmer MRN: [id number] DOB/AGE: August 22, 1956 68 y.o.  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 *   Discharged Condition: good  Hospital Course:  Brian Farmer is a 68 y.o presenting with cervical radiculopathy s/p C3-4 ACDF. His intraoperative course was uncomplicated. He was monitored in PACU for 4 hours and discharged home after ambulating, urinating, and tolerating PO intake. He was given prescriptions for Oxycodone , Robaxin , and Senna to take as needed.   Consults: None  Significant Diagnostic Studies: NA  Treatments: surgery: as above. Please see separately dictated operative report for further details   Discharge Exam: Blood pressure 119/66, pulse 67, temperature 98 F (36.7 C), temperature source Temporal, resp. rate 16, SpO2 100%. CN grossly intact Good and equal strength in BUE Incision c/d/I with dermabond in place  Disposition: Discharge disposition: 01-Home or Self Care        Allergies as of 07/06/2024       Reactions   Wound Dressing Adhesive Other (See Comments)   If longer then 72 hours        Medication List     STOP taking these medications    Advil 200 MG tablet Generic drug: ibuprofen       TAKE these medications    amLODipine 5 MG tablet Commonly known as: NORVASC Take 5 mg by mouth daily.   Fifty50 Pen Needles 31G X 8 MM Misc Generic drug: Insulin Pen Needle 4 (four) times daily   B-D ULTRAFINE III SHORT PEN 31G X 8 MM Misc Generic drug: Insulin Pen Needle 4 (four) times daily. as directed   cyanocobalamin 1000 MCG/ML injection Commonly known as: VITAMIN B12 Inject 1,000 mcg into the muscle every 30 (thirty) days.   dutasteride 0.5 MG capsule Commonly known as: AVODART Take 0.5 mg by mouth daily.    fluticasone 50 MCG/ACT nasal spray Commonly known as: FLONASE Place 1 spray into both nostrils daily as needed (Adhesive).   HYDROcodone-acetaminophen  5-325 MG tablet Commonly known as: NORCO/VICODIN Take 1 tablet by mouth every 6 (six) hours as needed for moderate pain (pain score 4-6) or severe pain (pain score 7-10).   insulin aspart 100 UNIT/ML FlexPen Commonly known as: NOVOLOG Pump 40 units over 72 hours   lisinopril 40 MG tablet Commonly known as: ZESTRIL Take 40 mg by mouth daily.   loratadine 10 MG tablet Commonly known as: CLARITIN Take 1 tablet by mouth daily as needed for allergies.   methocarbamol  500 MG tablet Commonly known as: ROBAXIN  Take 1 tablet (500 mg total) by mouth 4 (four) times daily.   Omnipod 5 DexG7G6 Pods Gen 5 Misc Inject into the skin.   OneTouch Delica Lancets 33G Misc by Does not apply route.   oxyCODONE  5 MG immediate release tablet Commonly known as: Roxicodone  Take 1 tablet (5 mg total) by mouth every 4 (four) hours as needed for up to 5 days for breakthrough pain (pain refractory to home Noro).   pantoprazole 40 MG tablet Commonly known as: PROTONIX Take 40 mg by mouth daily.   rosuvastatin 40 MG tablet Commonly known as: CRESTOR Take 40 mg by mouth daily.   senna 8.6 MG Tabs tablet Commonly known as: SENOKOT Take 1 tablet (8.6 mg total) by mouth 2 (two) times daily as needed.   tadalafil  5 MG tablet Commonly known as: CIALIS  Take 5  mg by mouth daily.   tamsulosin  0.4 MG Caps capsule Commonly known as: FLOMAX  Take 0.4 mg by mouth daily.   Viberzi  75 MG Tabs Generic drug: Eluxadoline  TAKE 1 TABLET BY MOUTH TWICE  DAILY What changed:  how much to take when to take this reasons to take this         Signed: Edsel Jama Goods 07/06/2024, 12:45 PM

## 2024-07-06 NOTE — Anesthesia Postprocedure Evaluation (Signed)
 Anesthesia Post Note  Patient: Brian Farmer  Procedure(s) Performed: ANTERIOR CERVICAL DECOMPRESSION/DISCECTOMY FUSION 1 LEVEL (Spine Cervical)  Patient location during evaluation: PACU Anesthesia Type: General Level of consciousness: awake and alert Pain management: pain level controlled Vital Signs Assessment: post-procedure vital signs reviewed and stable Respiratory status: spontaneous breathing, nonlabored ventilation and respiratory function stable Cardiovascular status: blood pressure returned to baseline and stable Postop Assessment: no apparent nausea or vomiting Anesthetic complications: no   No notable events documented.   Last Vitals:  Vitals:   07/06/24 1330 07/06/24 1357  BP: 118/65 128/73  Pulse: 70 80  Resp: 16 17  Temp: 36.6 C (!) 35.9 C  SpO2: 94% 99%    Last Pain:  Vitals:   07/06/24 1357  TempSrc: Temporal  PainSc: 6                  Fairy POUR Shanin Szymanowski

## 2024-07-06 NOTE — Op Note (Signed)
 Indications: [name] is a 68 y.o. 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   Preoperative Note:    Risks of surgery discussed include: infection, bleeding, stroke, coma, death, paralysis, CSF leak, nerve/spinal cord injury, numbness, tingling, weakness, complex regional pain syndrome, recurrent stenosis and/or disc herniation, vascular injury, development of instability, neck/back pain, need for further surgery, persistent symptoms, development of deformity, and the risks of anesthesia. The patient understood these risks and agreed to proceed.  Operative Note:   Procedure:  1) Anterior cervical diskectomy and fusion at C3-4 2) Anterior cervical instrumentation at C3-4 3) Insertion of biomechanical device at C3-4   Procedure: After obtaining informed consent, the patient taken to the operating room, placed in supine position, general anesthesia induced.  The patient had a small shoulder roll placed behind their shoulders.  The patient received preop antibiotics and IV Decadron .  A timeout was performed. The patient had a neck incision outlined, was prepped and draped in usual sterile fashion. The incision was injected with local anesthetic.   An incision was opened, dissection taken down medial to the carotid artery and jugular vein, lateral to the trachea and esophagus.  The prevertebral fascia was identified, and a localizing x-ray demonstrated the correct level.  The longus colli were dissected laterally, and self-retaining retractors placed to open the operative field. The microscope was then brought into the  field.  With this complete, distractor pins were placed in the vertebral bodies of C3 and C4. The distractor was placed, and the annulus at C3/4 was opened using a bovie.  Curettes and pituitary rongeurs used to remove the majority of disk, then the drill was used to remove the posterior osteophyte, expose the posterior longitudinal ligament, and begin the foraminotomies. The nerve hook was used to elevate the posterior longitudinal ligament, which was then removed with Kerrison rongeurs to complete decompression of the spinal cord. The Kerrison rongeurs were then used to complete the foraminotomies bilaterally to decompress the nerve roots. The nerve hook could be passed out each foramen, ensuring decompression of the nerve roots. Meticulous hemostasis was obtained. A biomechanical device (Globus Hedron C 7 mm height x 14 mm width by 12 mm depth) was placed at C3/4. The device had been filled with demineralized bone matrix for aid in arthrodesis.  Please note that the procedure included removal of the disc, removal of the posterior osteophytes, and removal of the posterior longitudinal ligament to ensure decompression of the spinal cord.  Additionally, foraminotomies were performed on both sides of the spinal canal to decompress the nerve roots.  The caspar distractor was removed, and bone wax used for hemostasis. A separate, 18 mm 2 segment Nuvasive ACP plate was chosen to bridge.  Two screws placed in each vertebral body, respectively making sure the screws were behind the locking mechanism.  Final AP and lateral radiographs were taken.   Please note that the plate is not inclusive to the biomechanical device.  The anchoring mechanism of the plate is completely separate from the biomechanical device.  With everything in good position, the wound was irrigated copiously and meticulous hemostasis obtained.  Wound was closed in 2 layers using interrupted inverted 3-0 Vicryl sutures.  The wound  was dressed  with dermabond, the head of bed at 30 degrees, taken to recovery room in stable condition.  No new postop neurological deficits were identified.  Sponge and pattie counts were correct at the end of the procedure.   I performed the entire procedure with the assistance of Edsel Goods PA as an Designer, television/film set. An assistant was required for this procedure due to the complexity.  The assistant provided assistance in tissue manipulation and suction, and was required for the successful and safe performance of the procedure. I performed the critical portions of the procedure.   Reeves Daisy MD

## 2024-07-06 NOTE — Discharge Instructions (Addendum)

## 2024-07-06 NOTE — Interval H&P Note (Signed)
 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  The various methods of treatment have been discussed with the patient and family. After consideration of risks, benefits and other options for treatment, the patient has consented to  Procedure(s) with comments: ANTERIOR CERVICAL DECOMPRESSION/DISCECTOMY FUSION 1 LEVEL (N/A) - C3-4 ANTERIOR CERVICAL DISCECTOMY AND FUSION as a surgical intervention.  The patient's history has been reviewed, patient examined, no change in status, stable for surgery.  I have reviewed the patient's chart and labs.  Questions were answered to the patient's satisfaction.    Heart sounds normal no MRG. Chest Clear to Auscultation Bilaterally.   [name]

## 2024-07-06 NOTE — Inpatient Diabetes Management (Signed)
 Inpatient Diabetes Program Recommendations  AACE/ADA: New Consensus Statement on Inpatient Glycemic Control   Target Ranges:  Prepandial:   less than 140 mg/dL      Peak postprandial:   less than 180 mg/dL (1-2 hours)      Critically ill patients:  140 - 180 mg/dL    Latest Reference Range & Units 07/06/24 10:08  Glucose-Capillary 70 - 99 mg/dL 876 (H)   Review of Glycemic Control  Diabetes history: DM1 Outpatient Diabetes medications: OmniPod insulin pump with Dexcom G7 CGM Current orders for Inpatient glycemic control: None  Inpatient Diabetes Program Recommendations:    Insulin Pump: If patient is admitted following procedure, please use Insulin Pump order set to order CBGs AC&HS and 2am and Insulin Pump AC&HS and 2am.  NOTE: Patient is currently in PO area for anterior cervical decompression/discectomy fusion. Per chart review, patient has Type 1 DM and uses an insulin pump for DM control. Patient sees Dr. Cherilyn (Endocrinologist) and was last seen on 03/25/24 and per office note:   He is on Novolog via the OP5 pump: Basal rates MN = 0.75 6AM = 1.55 TDD basal: 32.4 units  Bolus settings I/C: 8 ISF: 22 Target Glucose: 120 Active insulin time: 4 hours   If patient is admitted after procedure today and allowed to continue using his insulin pump, attending provider will need to use Insulin Pump order set to order CBGs AC&HS and 2 am and Insulin Pump AC&HS and 2am.   NURSING: Once insulin pump order set is ordered please print off the Patient insulin pump contract and flow sheet. The insulin pump contract should be signed by the patient and then placed in the chart. The patient insulin pump flow sheet will be completed by the patient at the bedside and the RN caring for the patient will use the patient's flow sheet to document in the [facility]. RN will need to complete the Nursing Insulin Pump Flowsheet at least once a shift. Patient will need to keep extra insulin pump supplies at  the bedside at all times.   Thanks, Earnie Gainer, RN, MSN, CDCES Diabetes Coordinator Inpatient Diabetes Program [phone] (Team Pager from 8am to 5pm)

## 2024-07-07 ENCOUNTER — Encounter: Payer: Self-pay | Admitting: Neurosurgery

## 2024-07-13 ENCOUNTER — Other Ambulatory Visit: Payer: Self-pay | Admitting: Neurosurgery

## 2024-07-13 ENCOUNTER — Telehealth: Payer: Self-pay | Admitting: Neurosurgery

## 2024-07-13 MED ORDER — OXYCODONE HCL 5 MG PO TABS: 5.0000 mg | ORAL_TABLET | Freq: Four times a day (QID) | ORAL | 0 refills | Status: DC | PRN

## 2024-07-13 MED ORDER — CELECOXIB 200 MG PO CAPS: 200.0000 mg | ORAL_CAPSULE | Freq: Two times a day (BID) | ORAL | 2 refills | Status: DC

## 2024-07-13 NOTE — Telephone Encounter (Signed)
 Patient is calling to let our office know that he is having no pain around his surgical site but he has been experiencing pain on the right side of his neck and shoulder. He states that it gradually increases through the day and he tries not to take pain medication unless he absolutely has to. He would like to know what he needs to do for the pain.

## 2024-07-13 NOTE — Telephone Encounter (Signed)
 I spoke with Mr Brian Farmer. He reports right sided neck pain and right shoulder blade that was particularly severe last night night. The pain is not as severe today. He completed his oxycodone  rx. He took a left over norco pill last night. He has been taking methocarbamol  2-3 times per day.  I explained that it is not uncommon to have neck pain and pain in between/near the shoulder blades after the type of surgery that he had and it is not unusual to still need pain medication only a week out from surgery. I encouraged him to try tylenol  and heat/ice as needed.  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 We discussed driving and work restrictions.    He would like a refill of oxycodone  to use as needed. He is aware he cannot drive while taking oxycodone .  He also inquired about taking advil. I informed him that Dr Clois advises against advil and other antiinflammatory medications until he is 3 months out from surgery because it can delay healing of the fusion. However, I will inquire about a prescription for celebrex .   [facility] in [place]

## 2024-07-13 NOTE — Telephone Encounter (Signed)
 Notified the patient.

## 2024-07-15 NOTE — Progress Notes (Signed)
   REFERRING PHYSICIAN:  Jeffie Cheryl BRAVO, Md [address] [place],  [place] [address]  DOS: 07/06/24  ACDF C3-C4  HISTORY OF PRESENT ILLNESS: Debra Calabretta is 2 weeks status post above surgery. Given oxycodone , robaxin  on discharge from the hospital.   He was called in celebrex  on 07/13/24.   Preop neck and left shoulder/trapezius pain- he feels like this pain is better. Had a flare up of pain yesterday.   He notes some vocal issues- he can't yell and voice cracks intermittently. No swallowing issues.   He is taking celebrex  and robaxin . Not taking oxycodone .    PHYSICAL EXAMINATION:  NEUROLOGICAL:  General: In no acute distress.   Awake, alert, oriented to person, place, and time.  Pupils equal round and reactive to light.  Facial tone is symmetric.    Strength: Side Biceps Triceps Deltoid Interossei Grip Wrist Ext. Wrist Flex.  R 5 5 5 5 5 5 5   L 5 5 5 5 5 5 5    Incision c/d/i  Imaging:  Nothing new to review.   Assessment / Plan: Merrick Maggio is doing well s/p above surgery. Treatment options reviewed with patient and following plan made:   - We discussed activity escalation and I have advised the patient to lift up to 10 pounds until 6 weeks after surgery (until follow up with Dr. Clois).   - Reviewed wound care.  - Continue current medications including celebrex  and prn robaxin . He is not taking oxycodone .  - Discussed that vocal concerns should improve.  - Follow up as scheduled in 4 weeks and prn. Will need xrays prior to this visit.   Advised to contact the office if any questions or concerns arise.   Glade Boys PA-C Dept of Neurosurgery

## 2024-07-21 ENCOUNTER — Ambulatory Visit (INDEPENDENT_AMBULATORY_CARE_PROVIDER_SITE_OTHER): Admitting: Orthopedic Surgery

## 2024-07-21 ENCOUNTER — Encounter: Payer: Self-pay | Admitting: Orthopedic Surgery

## 2024-07-21 VITALS — BP 118/60 | Temp 98.2°F | Ht 68.0 in | Wt 168.0 lb

## 2024-07-21 DIAGNOSIS — Z981 Arthrodesis status: Secondary | ICD-10-CM

## 2024-07-21 DIAGNOSIS — M5412 Radiculopathy, cervical region: Secondary | ICD-10-CM

## 2024-08-11 ENCOUNTER — Other Ambulatory Visit: Payer: Self-pay

## 2024-08-11 DIAGNOSIS — M5412 Radiculopathy, cervical region: Secondary | ICD-10-CM

## 2024-08-16 ENCOUNTER — Ambulatory Visit: Admitting: Neurosurgery

## 2024-08-16 ENCOUNTER — Ambulatory Visit

## 2024-08-16 VITALS — BP 132/64 | Temp 99.7°F | Ht 68.0 in | Wt 170.0 lb

## 2024-08-16 DIAGNOSIS — Z981 Arthrodesis status: Secondary | ICD-10-CM

## 2024-08-16 DIAGNOSIS — M47812 Spondylosis without myelopathy or radiculopathy, cervical region: Secondary | ICD-10-CM

## 2024-08-16 DIAGNOSIS — M5412 Radiculopathy, cervical region: Secondary | ICD-10-CM

## 2024-08-16 MED ORDER — GABAPENTIN 300 MG PO CAPS: 300.0000 mg | ORAL_CAPSULE | Freq: Three times a day (TID) | ORAL | 0 refills | Status: DC

## 2024-08-16 NOTE — Progress Notes (Signed)
   REFERRING PHYSICIAN:  Jeffie Cheryl BRAVO, Md [address] [place],  [place] [address]  DOS: 07/06/24  ACDF C3-C4  HISTORY OF PRESENT ILLNESS: Rivaldo Hineman is status post above surgery.   His pain has gotten worse.  He was doing better at his last appointment.  His voice is improving.  He is using Celebrex , Robaxin , and Tylenol .     PHYSICAL EXAMINATION:  NEUROLOGICAL:  General: In no acute distress.   Awake, alert, oriented to person, place, and time.  Pupils equal round and reactive to light.  Facial tone is symmetric.    Strength: Side Biceps Triceps Deltoid Interossei Grip Wrist Ext. Wrist Flex.  R 5 5 5 5 5 5 5   L 5 5 5 5 5 5 5    Incision c/d/i  Imaging:  No complications noted   Assessment / Plan: Omario Ander is doing poorly s/p above surgery.   I will start him on exercises.  I would like to send him for a left-sided C3-4 transforaminal injection.  Will also start gabapentin 300 mg 3 times a day.  I would like him to contact me after he starts the medications so that we can figure out whether dose escalation makes sense.  If he is not better at his next appointment, we will obtain a repeat MRI and nerve conduction study.     Reeves Daisy Dept of Neurosurgery

## 2024-09-09 ENCOUNTER — Other Ambulatory Visit: Payer: Self-pay

## 2024-09-09 DIAGNOSIS — M4312 Spondylolisthesis, cervical region: Secondary | ICD-10-CM

## 2024-09-09 DIAGNOSIS — M5412 Radiculopathy, cervical region: Secondary | ICD-10-CM

## 2024-09-11 NOTE — Progress Notes (Unsigned)
   REFERRING PHYSICIAN:  Jeffie Cheryl BRAVO, Md [address] [place],  [place] [address]  DOS: 07/06/24  ACDF C3-C4  HISTORY OF PRESENT ILLNESS:  He was having increased pain at his last visit. Dr. Clois. He also sent him for cervical injection.   He had left C3-C4 TF ESI with Dr. Avanell on 09/12/24.***    If not better, then get cervical MRI and EMG.    Preop neck and left shoulder/trapezius pain- he feels like this pain is better. Had a flare up of pain yesterday.   He notes some vocal issues- he can't yell and voice cracks intermittently. No swallowing issues.   He is taking celebrex  and robaxin . Not taking oxycodone .    PHYSICAL EXAMINATION:  NEUROLOGICAL:  General: In no acute distress.   Awake, alert, oriented to person, place, and time.  Pupils equal round and reactive to light.  Facial tone is symmetric.    Strength: Side Biceps Triceps Deltoid Interossei Grip Wrist Ext. Wrist Flex.  R 5 5 5 5 5 5 5   L 5 5 5 5 5 5 5    Incision well healed  Imaging:  Cervical xrays dated ***:  No complications noted. ***  Report for above xrays not yet available.   Assessment / Plan: Advaith Lamarque is doing well s/p above surgery. Treatment options reviewed with patient and following plan made:   - We discussed activity escalation and I have advised the patient to lift up to 10 pounds until 6 weeks after surgery (until follow up with Dr. Clois).   - Reviewed wound care.  - Continue current medications including celebrex  and prn robaxin . He is not taking oxycodone .  - Discussed that vocal concerns should improve.  - Follow up as scheduled in 4 weeks and prn. Will need xrays prior to this visit.   Advised to contact the office if any questions or concerns arise.   Glade Boys PA-C Dept of Neurosurgery

## 2024-09-15 ENCOUNTER — Ambulatory Visit (INDEPENDENT_AMBULATORY_CARE_PROVIDER_SITE_OTHER)

## 2024-09-15 ENCOUNTER — Encounter: Payer: Self-pay | Admitting: Orthopedic Surgery

## 2024-09-15 ENCOUNTER — Ambulatory Visit (INDEPENDENT_AMBULATORY_CARE_PROVIDER_SITE_OTHER): Admitting: Orthopedic Surgery

## 2024-09-15 VITALS — BP 124/70

## 2024-09-15 DIAGNOSIS — M5412 Radiculopathy, cervical region: Secondary | ICD-10-CM

## 2024-09-15 DIAGNOSIS — Z981 Arthrodesis status: Secondary | ICD-10-CM | POA: Diagnosis not present

## 2024-09-15 DIAGNOSIS — M4312 Spondylolisthesis, cervical region: Secondary | ICD-10-CM

## 2024-11-17 ENCOUNTER — Ambulatory Visit: Admitting: Anesthesiology

## 2024-11-17 ENCOUNTER — Encounter: Payer: Self-pay | Admitting: Gastroenterology

## 2024-11-17 ENCOUNTER — Ambulatory Visit
Admission: RE | Admit: 2024-11-17 | Discharge: 2024-11-17 | Disposition: A | Discharge status: Home / Self Care | Attending: Gastroenterology | Admitting: Gastroenterology

## 2024-11-17 ENCOUNTER — Encounter
Admission: RE | Disposition: A | Discharge status: Home / Self Care | Payer: Self-pay | Source: Home / Self Care | Attending: Gastroenterology

## 2024-11-17 ENCOUNTER — Other Ambulatory Visit: Payer: Self-pay

## 2024-11-17 DIAGNOSIS — K3189 Other diseases of stomach and duodenum: Secondary | ICD-10-CM | POA: Diagnosis not present

## 2024-11-17 DIAGNOSIS — K219 Gastro-esophageal reflux disease without esophagitis: Secondary | ICD-10-CM | POA: Diagnosis not present

## 2024-11-17 DIAGNOSIS — K317 Polyp of stomach and duodenum: Secondary | ICD-10-CM | POA: Diagnosis not present

## 2024-11-17 DIAGNOSIS — E119 Type 2 diabetes mellitus without complications: Secondary | ICD-10-CM | POA: Insufficient documentation

## 2024-11-17 DIAGNOSIS — I10 Essential (primary) hypertension: Secondary | ICD-10-CM | POA: Diagnosis not present

## 2024-11-17 DIAGNOSIS — R1310 Dysphagia, unspecified: Secondary | ICD-10-CM | POA: Insufficient documentation

## 2024-11-17 HISTORY — PX: BIOPSY OF SKIN SUBCUTANEOUS TISSUE AND/OR MUCOUS MEMBRANE: SHX6741

## 2024-11-17 HISTORY — PX: ESOPHAGOGASTRODUODENOSCOPY: SHX5428

## 2024-11-17 HISTORY — PX: MALONEY DILATION: SHX5535

## 2024-11-17 LAB — GLUCOSE, CAPILLARY: Glucose-Capillary: 101 mg/dL — ABNORMAL HIGH (ref 70–99)

## 2024-11-17 MED ORDER — GLYCOPYRROLATE 0.2 MG/ML IJ SOLN
INTRAMUSCULAR | Status: AC
  Filled 2024-11-17: qty 1

## 2024-11-17 MED ORDER — DEXMEDETOMIDINE HCL IN NACL 80 MCG/20ML IV SOLN
INTRAVENOUS | Status: DC | PRN
  Administered 2024-11-17: 12 ug via INTRAVENOUS
  Administered 2024-11-17: 8 ug via INTRAVENOUS

## 2024-11-17 MED ORDER — LIDOCAINE HCL (CARDIAC) PF 100 MG/5ML IV SOSY
PREFILLED_SYRINGE | INTRAVENOUS | Status: DC | PRN
  Administered 2024-11-17: 60 mg via INTRAVENOUS

## 2024-11-17 MED ORDER — SODIUM CHLORIDE 0.9 % IV SOLN: INTRAVENOUS | Status: DC

## 2024-11-17 MED ORDER — LIDOCAINE HCL (PF) 2 % IJ SOLN
INTRAMUSCULAR | Status: AC
  Filled 2024-11-17: qty 5

## 2024-11-17 MED ORDER — DEXMEDETOMIDINE HCL IN NACL 80 MCG/20ML IV SOLN
INTRAVENOUS | Status: AC
  Filled 2024-11-17: qty 20

## 2024-11-17 MED ORDER — PROPOFOL 500 MG/50ML IV EMUL
INTRAVENOUS | Status: DC | PRN
  Administered 2024-11-17: 75 ug/kg/min via INTRAVENOUS

## 2024-11-17 MED ORDER — PHENYLEPHRINE 80 MCG/ML (10ML) SYRINGE FOR IV PUSH (FOR BLOOD PRESSURE SUPPORT)
PREFILLED_SYRINGE | INTRAVENOUS | Status: AC
  Filled 2024-11-17: qty 10

## 2024-11-17 MED ORDER — PROPOFOL 10 MG/ML IV BOLUS
INTRAVENOUS | Status: DC | PRN
  Administered 2024-11-17 (×2): 50 mg via INTRAVENOUS

## 2024-11-17 NOTE — Transfer of Care (Signed)
 Immediate Anesthesia Transfer of Care Note  Patient: Brian Farmer  Procedure(s) Performed: EGD (ESOPHAGOGASTRODUODENOSCOPY) BIOPSY, GI DILATION, ESOPHAGUS, USING MALONEY DILATOR  Patient Location: PACU  Anesthesia Type:General  Level of Consciousness: sedated  Airway & Oxygen Therapy: Patient Spontanous Breathing  Post-op Assessment: Report given to RN and Post -op Vital signs reviewed and stable  Post vital signs: Reviewed and stable  Last Vitals:  Vitals Value Taken Time  BP    Temp    Pulse 66 11/17/24 09:34  Resp 13 11/17/24 09:34  SpO2 97 % 11/17/24 09:34  Vitals shown include unfiled device data.  Last Pain:  Vitals:   11/17/24 0905  TempSrc: Temporal         Complications: No notable events documented.

## 2024-11-17 NOTE — Op Note (Signed)
 [facility] Gastroenterology Patient Name: Brian Farmer Procedure Date: 11/17/2024 9:04 AM MRN: [id number] Account #: [id number] Date of Birth: 01-07-1956 Admit Type: Outpatient Age: 69 Room: [facility] ENDO ROOM 1 Gender: Male Note Status: Finalized Instrument Name: Upper GI Scope [id number] Procedure:             Upper GI endoscopy Indications:           Dysphagia Providers:             Elspeth Ozell Onita ROSALEA, DO Referring MD:          Elspeth Ozell Onita DO, DO (Referring MD), Cheryl CHARLENA Jericho (Referring MD) Medicines:             Monitored Anesthesia Care Complications:         No immediate complications. Estimated blood loss:                         Minimal. Procedure:             Pre-Anesthesia Assessment:                        - Prior to the procedure, a History and Physical was                         performed, and patient medications and allergies were                         reviewed. The patient is competent. The risks and                         benefits of the procedure and the sedation options and                         risks were discussed with the patient. All questions                         were answered and informed consent was obtained.                         Patient identification and proposed procedure were                         verified by the physician, the nurse, the anesthetist                         and the technician in the endoscopy suite. Mental                         Status Examination: alert and oriented. Airway                         Examination: normal oropharyngeal airway and neck                         mobility. Respiratory Examination: clear to  auscultation. CV Examination: RRR, no murmurs, no S3                         or S4. Prophylactic Antibiotics: The patient does not                         require prophylactic antibiotics. Prior                          Anticoagulants: The patient has taken no anticoagulant                         or antiplatelet agents. ASA Grade Assessment: II - A                         patient with mild systemic disease. After reviewing                         the risks and benefits, the patient was deemed in                         satisfactory condition to undergo the procedure. The                         anesthesia plan was to use monitored anesthesia care                         (MAC). Immediately prior to administration of                         medications, the patient was re-assessed for adequacy                         to receive sedatives. The heart rate, respiratory                         rate, oxygen saturations, blood pressure, adequacy of                         pulmonary ventilation, and response to care were                         monitored throughout the procedure. The physical                         status of the patient was re-assessed after the                         procedure.                        After obtaining informed consent, the endoscope was                         passed under direct vision. Throughout the procedure,                         the patient's blood pressure, pulse, and oxygen  saturations were monitored continuously. The Endoscope                         was introduced through the mouth, and advanced to the                         second part of duodenum. The upper GI endoscopy was                         accomplished without difficulty. The patient tolerated                         the procedure well. Findings:      The duodenal bulb, first portion of the duodenum and second portion of       the duodenum were normal. Biopsies for histology were taken with a cold       forceps for evaluation of celiac disease. Estimated blood loss was       minimal.      A few 1 to 10 mm pedunculated and sessile polyps with no bleeding and no       stigmata of  recent bleeding were found in the cardia and in the gastric       fundus. Biopsies were taken with a cold forceps for histology. Estimated       blood loss was minimal.      The exam of the stomach was otherwise normal.      The Z-line was regular. Estimated blood loss: none.      Esophagogastric landmarks were identified: the gastroesophageal junction       was found at 40 cm from the incisors.      No endoscopic abnormality was evident in the esophagus to explain the       patient's complaint of dysphagia. It was decided, however, to proceed       with dilation of the entire esophagus. The scope was withdrawn. Dilation       was performed with a Maloney dilator with no resistance at 52 Fr. The       dilation site was examined following endoscope reinsertion and showed no       change. Biopsies were obtained from the proximal and distal esophagus       with cold forceps for histology of suspected eosinophilic esophagitis.       Estimated blood loss was minimal.      The exam of the esophagus was otherwise normal. Impression:            - Normal duodenal bulb, first portion of the duodenum                         and second portion of the duodenum. Biopsied.                        - A few gastric polyps. Biopsied.                        - Z-line regular.                        - Esophagogastric landmarks identified.                        -  No endoscopic esophageal abnormality to explain                         patient's dysphagia. Esophagus dilated. Dilated.                        - Biopsies were taken with a cold forceps for                         evaluation of eosinophilic esophagitis. Recommendation:        - Patient has a contact number available for                         emergencies. The signs and symptoms of potential                         delayed complications were discussed with the patient.                         Return to normal activities tomorrow. Written                          discharge instructions were provided to the patient.                        - Discharge patient to home.                        - Resume previous diet.                        - Continue present medications.                        - Await pathology results.                        - Repeat upper endoscopy PRN for retreatment.                        - If no improvement in swallowing, can consider                         manometry and motility study                        - Return to GI clinic as previously scheduled.                        - The findings and recommendations were discussed with                         the patient. Procedure Code(s):     --- Professional ---                        [phone], Esophagogastroduodenoscopy, flexible,                         transoral; with biopsy, single or multiple  43450, Dilation of esophagus, by unguided sound or                         bougie, single or multiple passes Diagnosis Code(s):     --- Professional ---                        K31.7, Polyp of stomach and duodenum                        R13.10, Dysphagia, unspecified CPT copyright 2022 American Medical Association. All rights reserved. The codes documented in this report are preliminary and upon coder review may  be revised to meet current compliance requirements. Attending Participation:      I personally performed the entire procedure. Elspeth Jungling, DO [name] DO, DO 11/17/2024 9:33:38 AM This report has been signed electronically. Number of Addenda: 0 Note Initiated On: 11/17/2024 9:04 AM Estimated Blood Loss:  Estimated blood loss was minimal.      [facility]

## 2024-11-17 NOTE — Anesthesia Postprocedure Evaluation (Signed)
"   Anesthesia Post Note  Patient: Brian Farmer  Procedure(s) Performed: EGD (ESOPHAGOGASTRODUODENOSCOPY) BIOPSY, GI DILATION, ESOPHAGUS, USING MALONEY DILATOR  Patient location during evaluation: Endoscopy Anesthesia Type: General Level of consciousness: awake and alert Pain management: pain level controlled Vital Signs Assessment: post-procedure vital signs reviewed and stable Respiratory status: spontaneous breathing, nonlabored ventilation, respiratory function stable and patient connected to nasal cannula oxygen Cardiovascular status: blood pressure returned to baseline and stable Postop Assessment: no apparent nausea or vomiting Anesthetic complications: no   No notable events documented.   Last Vitals:  Vitals:   11/17/24 0905  BP: 139/65  Pulse: 62  Resp: 16  Temp: (!) 35.9 C  SpO2: 100%    Last Pain:  Vitals:   11/17/24 0905  TempSrc: Temporal                 Fairy A Dwanna Goshert      "

## 2024-11-17 NOTE — Anesthesia Preprocedure Evaluation (Signed)
"                                    Anesthesia Evaluation  Patient identified by MRN, date of birth, ID band Patient awake    Reviewed: Allergy  & Precautions, H&P , NPO status , Patient's Chart, lab work & pertinent test results  Airway Mallampati: II  TM Distance: >3 FB Neck ROM: Full    Dental no notable dental hx. (+) Teeth Intact   Pulmonary neg pulmonary ROS, Current Smoker and Patient abstained from smoking.   Pulmonary exam normal breath sounds clear to auscultation       Cardiovascular hypertension, negative cardio ROS Normal cardiovascular exam Rhythm:Regular Rate:Normal     Neuro/Psych negative neurological ROS  negative psych ROS   GI/Hepatic negative GI ROS, Neg liver ROS,,,  Endo/Other  negative endocrine ROSdiabetes    Renal/GU negative Renal ROS  negative genitourinary   Musculoskeletal negative musculoskeletal ROS (+)    Abdominal   Peds negative pediatric ROS (+)  Hematology negative hematology ROS (+)   Anesthesia Other Findings   Reproductive/Obstetrics negative OB ROS                              Anesthesia Physical Anesthesia Plan  ASA: 3  Anesthesia Plan: General   Post-op Pain Management:    Induction: Intravenous  PONV Risk Score and Plan: 0  Airway Management Planned:   Additional Equipment:   Intra-op Plan:   Post-operative Plan: Extubation in OR  Informed Consent: I have reviewed the patients History and Physical, chart, labs and discussed the procedure including the risks, benefits and alternatives for the proposed anesthesia with the patient or authorized representative who has indicated his/her understanding and acceptance.     Dental advisory given  Plan Discussed with: CRNA  Anesthesia Plan Comments:         Anesthesia Quick Evaluation  "

## 2024-11-17 NOTE — Interval H&P Note (Signed)
 History and Physical Interval Note: Preprocedure H&P from 11/17/24  was reviewed and there was no interval change after seeing and examining the patient.  Written consent was obtained from the patient after discussion of risks, benefits, and alternatives. Patient has consented to proceed with Esophagogastroduodenoscopy with possible intervention   11/17/2024 9:08 AM  Brian Farmer  has presented today for surgery, with the diagnosis of Dysphagia, unspecified type (R13.10).  The various methods of treatment have been discussed with the patient and family. After consideration of risks, benefits and other options for treatment, the patient has consented to  Procedures: EGD (ESOPHAGOGASTRODUODENOSCOPY) (N/A) as a surgical intervention.  The patient's history has been reviewed, patient examined, no change in status, stable for surgery.  I have reviewed the patient's chart and labs.  Questions were answered to the patient's satisfaction.     [name]

## 2024-11-17 NOTE — H&P (Signed)
 "  Pre-Procedure H&P   Patient ID: Brian Farmer is a 69 y.o. male.  Gastroenterology Provider: [name], DO  Referring Provider: Dr. Jeffie PCP: Jeffie Cheryl BRAVO, MD  Date: 11/17/2024  HPI [name] is a 69 y.o. male who presents today for Esophagogastroduodenoscopy for Dysphagia .  Patient with difficulty swallowing solids that is developed over the last few months.  No odynophagia.  No issues with liquids or pills.  Reflux is well-controlled  Patient with dysphagia to solids.  Last underwent EGD in 2018 with grade a esophagitis   Past Medical History:  Diagnosis Date   Allergic rhinitis    Anterolisthesis of cervical spine    Arthritis    BPH (benign prostatic hyperplasia)    Cervical disc disease    Cervical nerve root compression    DDD (degenerative disc disease), cervical    Diabetes mellitus without complication (HCC)    Erectile disorder 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    Family history of colon cancer    Family history of leukemia    Family history of leukemia    GERD (gastroesophageal reflux disease)    Headache    migraine   Hyperlipidemia    Hypertension    Lynch syndrome    Pancreatitis    Radiculopathy affecting upper extremity    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   Spondylosis of cervical spine     Past Surgical History:  Procedure Laterality Date   ANKLE SURGERY Right    torn tendon repair   ANTERIOR CERVICAL DECOMP/DISCECTOMY FUSION N/A 07/06/2024   Procedure: ANTERIOR CERVICAL DECOMPRESSION/DISCECTOMY FUSION 1 LEVEL;  Surgeon: Clois Fret, MD;  Location: [facility] ORS;  Service: Neurosurgery;  Laterality: N/A;  C3-4 ANTERIOR CERVICAL DISCECTOMY AND FUSION   COLONOSCOPY WITH PROPOFOL  N/A 08/02/2015   Procedure: COLONOSCOPY WITH PROPOFOL ;  Surgeon: Deward CINDERELLA Piedmont, MD;  Location: [facility] ENDOSCOPY;  Service: Gastroenterology;  Laterality: N/A;   COLONOSCOPY WITH PROPOFOL  N/A  03/04/2017   Procedure: COLONOSCOPY WITH PROPOFOL ;  Surgeon: Lamar ONEIDA Holmes, MD;  Location: [facility] ENDOSCOPY;  Service: Endoscopy;  Laterality: N/A;   ESOPHAGOGASTRODUODENOSCOPY (EGD) WITH PROPOFOL  N/A 03/04/2017   Procedure: ESOPHAGOGASTRODUODENOSCOPY (EGD) WITH PROPOFOL ;  Surgeon: Lamar ONEIDA Holmes, MD;  Location: [facility]COPY;  Service: Endoscopy;  Laterality: N/A;   SHOULDER SURGERY Right    rotator cuff repair   TRIGGER FINGER RELEASE Bilateral     Family History Nephew- crc No other h/o GI disease or malignancy  Review of Systems  Constitutional:  Negative for activity change, appetite change, chills, diaphoresis, fatigue, fever and unexpected weight change.  HENT:  Positive for trouble swallowing. Negative for voice change.   Respiratory:  Negative for shortness of breath and wheezing.   Cardiovascular:  Negative for chest pain, palpitations and leg swelling.  Gastrointestinal:  Negative for abdominal distention, abdominal pain, anal bleeding, blood in stool, constipation, diarrhea, nausea and vomiting.  Musculoskeletal:  Negative for arthralgias and myalgias.  Skin:  Negative for color change and pallor.  Neurological:  Negative for dizziness, syncope and weakness.  Psychiatric/Behavioral:  Negative for confusion. The patient is not nervous/anxious.   All other systems reviewed and are negative.    Medications Medications Ordered Prior to Encounter[1]  Pertinent medications related to GI and procedure were reviewed by me with the patient prior to the procedure  Current Medications[2]  sodium chloride          Allergies[3] Allergies were reviewed by me prior to  the procedure  Objective   Body mass index is 25.7 kg/m. Vitals:   11/17/24 0905  BP: 139/65  Pulse: 62  Resp: 16  Temp: (!) 96.6 F (35.9 C)  TempSrc: Temporal  SpO2: 100%  Weight: 76.7 kg  Height: 5' 8 (1.727 m)     Physical Exam Vitals and nursing note reviewed.  Constitutional:       General: He is not in acute distress.    Appearance: Normal appearance. He is not ill-appearing, toxic-appearing or diaphoretic.  HENT:     Head: Normocephalic and atraumatic.     Nose: Nose normal.     Mouth/Throat:     Mouth: Mucous membranes are moist.     Pharynx: Oropharynx is clear.  Eyes:     General: No scleral icterus.    Extraocular Movements: Extraocular movements intact.  Cardiovascular:     Rate and Rhythm: Normal rate and regular rhythm.     Heart sounds: Normal heart sounds. No murmur heard.    No friction rub. No gallop.  Pulmonary:     Effort: Pulmonary effort is normal. No respiratory distress.     Breath sounds: Normal breath sounds. No wheezing, rhonchi or rales.  Abdominal:     General: Bowel sounds are normal. There is no distension.     Palpations: Abdomen is soft.     Tenderness: There is no abdominal tenderness. There is no guarding or rebound.  Musculoskeletal:     Cervical back: Neck supple.     Right lower leg: No edema.     Left lower leg: No edema.  Skin:    General: Skin is warm and dry.     Coloration: Skin is not jaundiced or pale.  Neurological:     General: No focal deficit present.     Mental Status: He is alert and oriented to person, place, and time. Mental status is at baseline.  Psychiatric:        Mood and Affect: Mood normal.        Behavior: Behavior normal.        Thought Content: Thought content normal.        Judgment: Judgment normal.      Assessment:  [name] is a 69 y.o. male  who presents today for Esophagogastroduodenoscopy for Dysphagia .  Plan:  Esophagogastroduodenoscopy with possible intervention today  Esophagogastroduodenoscopy with possible biopsy, control of bleeding, polypectomy, and interventions as necessary has been discussed with the patient/patient representative. Informed consent was obtained from the patient/patient representative after explaining the indication, nature, and risks of the  procedure including but not limited to death, bleeding, perforation, missed neoplasm/lesions, cardiorespiratory compromise, and reaction to medications. Opportunity for questions was given and appropriate answers were provided. Patient/patient representative has verbalized understanding is amenable to undergoing the procedure.   [name], DO  [facility]  Portions of the record may have been created with voice recognition software. Occasional wrong-word or 'sound-a-like' substitutions may have occurred due to the inherent limitations of voice recognition software.  Read the chart carefully and recognize, using context, where substitutions may have occurred.     [1]  No current facility-administered medications on file prior to encounter.   Current Outpatient Medications on File Prior to Encounter  Medication Sig Dispense Refill   amLODipine (NORVASC) 5 MG tablet Take 5 mg by mouth daily.     cyanocobalamin (,VITAMIN B-12,) 1000 MCG/ML injection Inject 1,000 mcg into the muscle every 30 (thirty) days.  dutasteride (AVODART) 0.5 MG capsule Take 0.5 mg by mouth daily.     fluticasone (FLONASE) 50 MCG/ACT nasal spray Place 1 spray into both nostrils daily as needed (Adhesive).     insulin aspart (NOVOLOG) 100 UNIT/ML FlexPen Pump 40 units over 72 hours     lisinopril (PRINIVIL,ZESTRIL) 40 MG tablet Take 40 mg by mouth daily.     loratadine (CLARITIN) 10 MG tablet Take 1 tablet by mouth daily as needed for allergies.     pantoprazole (PROTONIX) 40 MG tablet Take 40 mg by mouth daily.     rosuvastatin (CRESTOR) 40 MG tablet Take 40 mg by mouth daily.  3   tadalafil  (CIALIS ) 5 MG tablet Take 5 mg by mouth daily.     tamsulosin  (FLOMAX ) 0.4 MG CAPS capsule Take 0.4 mg by mouth daily.     VIBERZI  75 MG TABS TAKE 1 TABLET BY MOUTH TWICE  DAILY (Patient taking differently: Take 37.5 mg by mouth daily as needed (IBS).) 60 tablet 5   [DISCONTINUED] omeprazole  (PRILOSEC) 20 MG capsule Take 20 mg by mouth daily.    [2]  Current Facility-Administered Medications:    0.9 %  sodium chloride  infusion, , Intravenous, Continuous, Onita Elspeth Sharper, DO [3]  Allergies Allergen Reactions   Wound Dressing Adhesive Other (See Comments)    If longer then 72 hours   "

## 2024-11-18 LAB — SURGICAL PATHOLOGY

## 2025-03-14 ENCOUNTER — Ambulatory Visit: Admitting: Neurosurgery
